# Patient Record
Sex: Female | Born: 1990 | Race: White | Hispanic: No | Smoking: Never smoker
Health system: Southern US, Community
[De-identification: ages and names within clinical notes are randomized; demographics above are authoritative.]

## PROBLEM LIST (undated history)

## (undated) DIAGNOSIS — F32A Depression, unspecified: Secondary | ICD-10-CM

## (undated) DIAGNOSIS — F419 Anxiety disorder, unspecified: Secondary | ICD-10-CM

## (undated) DIAGNOSIS — T7840XA Allergy, unspecified, initial encounter: Secondary | ICD-10-CM

## (undated) HISTORY — PX: FRACTURE SURGERY: SHX138

## (undated) HISTORY — DX: Depression, unspecified: F32.A

## (undated) HISTORY — DX: Anxiety disorder, unspecified: F41.9

## (undated) HISTORY — DX: Allergy, unspecified, initial encounter: T78.40XA

## (undated) HISTORY — PX: TONSILLECTOMY: SUR1361

## (undated) HISTORY — PX: ANKLE SURGERY: SHX546

---
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
## 2010-03-04 HISTORY — PX: KNEE SURGERY: SHX244

## 2012-10-09 ENCOUNTER — Ambulatory Visit: Payer: Self-pay | Admitting: Family Medicine

## 2013-02-10 ENCOUNTER — Ambulatory Visit: Payer: BC Managed Care – PPO | Admitting: Family Medicine

## 2013-02-10 VITALS — BP 118/86 | HR 78 | Temp 98.3°F | Resp 16 | Ht 62.5 in | Wt 139.6 lb

## 2013-02-10 DIAGNOSIS — J029 Acute pharyngitis, unspecified: Secondary | ICD-10-CM

## 2013-02-10 DIAGNOSIS — R05 Cough: Secondary | ICD-10-CM

## 2013-02-10 LAB — POCT RAPID STREP A (OFFICE): Rapid Strep A Screen: NEGATIVE

## 2013-02-10 LAB — POCT CBC
Lymph, poc: 3.3 (ref 0.6–3.4)
MCH, POC: 29 pg (ref 27–31.2)
MCHC: 31.3 g/dL — AB (ref 31.8–35.4)
MCV: 92.8 fL (ref 80–97)
MID (cbc): 0.8 (ref 0–0.9)
MPV: 7.2 fL (ref 0–99.8)
POC LYMPH PERCENT: 36.9 %L (ref 10–50)
POC MID %: 9.1 %M (ref 0–12)
Platelet Count, POC: 304 10*3/uL (ref 142–424)
RBC: 4.1 M/uL (ref 4.04–5.48)
RDW, POC: 13 %
WBC: 9 10*3/uL (ref 4.6–10.2)

## 2013-02-10 MED ORDER — PSEUDOEPHEDRINE HCL ER 120 MG PO TB12: 120.0000 mg | ORAL_TABLET | Freq: Two times a day (BID) | ORAL | Status: DC

## 2013-02-10 MED ORDER — HYDROCODONE-HOMATROPINE 5-1.5 MG/5ML PO SYRP: 5.0000 mL | ORAL_SOLUTION | Freq: Three times a day (TID) | ORAL | Status: DC | PRN

## 2013-02-10 MED ORDER — AZITHROMYCIN 250 MG PO TABS: ORAL_TABLET | ORAL | Status: DC

## 2013-02-10 MED ORDER — IPRATROPIUM BROMIDE 0.03 % NA SOLN: 2.0000 | Freq: Four times a day (QID) | NASAL | Status: DC

## 2013-02-10 NOTE — Progress Notes (Signed)
Subjective:    Patient ID: Anna Vargas, female    DOB: 1990-07-16, 22 y.o.   MRN: [id number] This chart was scribed for Anna Croft, MD by Clydene Laming, ED Scribe. This patient was seen in room 10 and the patient's care was started at 8:57 PM. Sore Throat  Associated symptoms include congestion, coughing, ear pain and headaches. Pertinent negatives include no vomiting.  Cough Associated symptoms include ear pain, a fever, headaches, myalgias, postnasal drip, rhinorrhea and a sore throat. Pertinent negatives include no chest pain or chills.   HPI Comments: Anna Vargas is a 22 y.o. female who presents to the [facility] complaining of a sore throat onset seven days ago with associated congestion, myalgias, fever, rhinorrhea, and a productive cough. Pt visited [employer] the day before symptoms began. Pt denies nausea or vomiting. Pt has taken tylenol and dayquil for mild, temporary relief.   There are no active problems to display for this patient.  History reviewed. No pertinent past medical history. Past Surgical History  Procedure Laterality Date  . Knee surgery  2012   Allergies  Allergen Reactions  . Latex Anaphylaxis   Prior to Admission medications   Not on File   History   Social History  . Marital Status: Single    Spouse Name: N/A    Number of Children: N/A  . Years of Education: N/A   Occupational History  . Not on file.   Social History Main Topics  . Smoking status: Never Smoker   . Smokeless tobacco: Not on file  . Alcohol Use: No  . Drug Use: No  . Sexual Activity: No   Other Topics Concern  . Not on file   Social History Narrative  . No narrative on file       Review of Systems  Constitutional: Positive for fever, diaphoresis, activity change, appetite change and fatigue. Negative for chills.  HENT: Positive for congestion, ear pain, postnasal drip, rhinorrhea and sore throat. Negative for dental problem and sinus  pressure.   Respiratory: Positive for cough. Negative for choking.   Cardiovascular: Negative for chest pain.  Gastrointestinal: Negative for nausea and vomiting.  Musculoskeletal: Positive for arthralgias and myalgias. Negative for neck stiffness.  Neurological: Positive for headaches.  Hematological: Positive for adenopathy.  Psychiatric/Behavioral: Positive for self-injury.      BP 118/86  Pulse 78  Temp(Src) 98.3 F (36.8 C) (Oral)  Resp 16  Ht 5' 2.5" (1.588 m)  Wt 139 lb 9.6 oz (63.322 kg)  BMI 25.11 kg/m2  SpO2 100%  LMP 02/07/2013 Objective:   Physical Exam  Nursing note and vitals reviewed. Constitutional: She is oriented to person, place, and time. She appears well-developed and well-nourished. No distress.  HENT:  Head: Normocephalic and atraumatic.  Right Ear: External ear and ear canal normal. A middle ear effusion is present.  Left Ear: External ear and ear canal normal. A middle ear effusion is present.  Nose: Nose normal. No mucosal edema or rhinorrhea.  Mouth/Throat: Uvula is midline and mucous membranes are normal. Mucous membranes are not pale and not dry. No trismus in the jaw. No uvula swelling. Posterior oropharyngeal edema present. No oropharyngeal exudate, posterior oropharyngeal erythema or tonsillar abscesses.  White streaking in posterial oropharnyx 2+ tonsillar swelling w/o erythema, no exudate  Eyes: Conjunctivae and EOM are normal. Right eye exhibits no discharge. Left eye exhibits no discharge. No scleral icterus.  Neck: Normal range of motion. Neck supple.  Cardiovascular: Normal rate,  regular rhythm, normal heart sounds and intact distal pulses.   Pulmonary/Chest: Effort normal and breath sounds normal. No respiratory distress.  Abdominal: Soft.  Musculoskeletal: Normal range of motion.  Lymphadenopathy:       Head (right side): Tonsillar adenopathy present. No submandibular, no preauricular and no posterior auricular adenopathy present.        Head (left side): Tonsillar adenopathy present. No submandibular, no preauricular and no posterior auricular adenopathy present.    She has cervical adenopathy.       Right cervical: Superficial cervical adenopathy present. No posterior cervical adenopathy present.      Left cervical: Superficial cervical adenopathy present. No posterior cervical adenopathy present.       Right: No supraclavicular adenopathy present.       Left: No supraclavicular adenopathy present.  Neurological: She is alert and oriented to person, place, and time.  Skin: Skin is warm and dry. She is not diaphoretic. No erythema.  Psychiatric: She has a normal mood and affect. Her behavior is normal. Judgment normal.      Results for orders placed in visit on 02/10/13  POCT INFLUENZA A/B      Result Value Range   Influenza A, POC Negative     Influenza B, POC Negative    POCT RAPID STREP A (OFFICE)      Result Value Range   Rapid Strep A Screen Negative  Negative  POCT CBC      Result Value Range   WBC 9.0  4.6 - 10.2 K/uL   Lymph, poc 3.3  0.6 - 3.4   POC LYMPH PERCENT 36.9  10 - 50 %L   MID (cbc) 0.8  0 - 0.9   POC MID % 9.1  0 - 12 %M   POC Granulocyte 4.9  2 - 6.9   Granulocyte percent 54.0  37 - 80 %G   RBC 4.10  4.04 - 5.48 M/uL   Hemoglobin 11.9 (*) 12.2 - 16.2 g/dL   HCT, POC 16.1  09.6 - 47.9 %   MCV 92.8  80 - 97 fL   MCH, POC 29.0  27 - 31.2 pg   MCHC 31.3 (*) 31.8 - 35.4 g/dL   RDW, POC 04.5     Platelet Count, POC 304  142 - 424 K/uL   MPV 7.2  0 - 99.8 fL   Assessment & Plan:  Sore throat - Plan: POCT Influenza A/B, POCT rapid strep A, POCT CBC, Epstein-Barr virus VCA antibody panel  Cough - Plan: POCT Influenza A/B, POCT rapid strep A. SNAP rx for zpack given in case sxs worsen. Advised symptomatic care while throat clx and EBV is pending.  Meds ordered this encounter  Medications  . azithromycin (ZITHROMAX) 250 MG tablet    Sig: Take 2 tabs PO x 1 dose, then 1 tab PO QD x 4 days     Dispense:  6 tablet    Refill:  0  . HYDROcodone-homatropine (HYCODAN) 5-1.5 MG/5ML syrup    Sig: Take 5 mLs by mouth every 8 (eight) hours as needed for cough.    Dispense:  120 mL    Refill:  0  . ipratropium (ATROVENT) 0.03 % nasal spray    Sig: Place 2 sprays into the nose 4 (four) times daily.    Dispense:  30 mL    Refill:  1  . pseudoephedrine (SUDAFED 12 HOUR) 120 MG 12 hr tablet    Sig: Take 1 tablet (120 mg total) by  mouth 2 (two) times daily.    Dispense:  30 tablet    Refill:  0     I personally performed the services described in this documentation, which was scribed in my presence. The recorded information has been reviewed and considered, and addended by me as needed.  Norberto Sorenson, MD MPH

## 2013-02-10 NOTE — Progress Notes (Deleted)
Subjective:    Patient ID: Anna Vargas, female    DOB: 1990/08/11, 22 y.o.   MRN: [id number] This chart was scribed for Clelia Croft, MD by Clydene Laming, ED Scribe. This patient was seen in room 10 and the patient's care was started at 8:57 PM. HPI HPI Comments: Anna Vargas is a 22 y.o. female who presents to the [facility] complaining of a sore throat onset seven days ago with associated congestion, myalgias, fever, rhinorrhea, and a productive cough. Pt visited [employer] the day before symptoms began. Pt denies nausea or vomiting. Pt has taken tylenol and dayquil for mild, temporary relief.   There are no active problems to display for this patient.  History reviewed. No pertinent past medical history. Past Surgical History  Procedure Laterality Date   Knee surgery  2012   Allergies  Allergen Reactions   Latex Anaphylaxis   Prior to Admission medications   Not on File   History   Social History   Marital Status: Single    Spouse Name: N/A    Number of Children: N/A   Years of Education: N/A   Occupational History   Not on file.   Social History Main Topics   Smoking status: Never Smoker    Smokeless tobacco: Not on file   Alcohol Use: No   Drug Use: No   Sexual Activity: No   Other Topics Concern   Not on file   Social History Narrative   No narrative on file       Review of Systems  Constitutional: Positive for fever, diaphoresis, activity change, appetite change and fatigue. Negative for chills.  HENT: Positive for congestion, ear pain, postnasal drip, rhinorrhea and sore throat. Negative for dental problem and sinus pressure.   Respiratory: Positive for cough. Negative for choking.   Cardiovascular: Negative for chest pain.  Gastrointestinal: Negative for nausea and vomiting.  Musculoskeletal: Positive for arthralgias and myalgias. Negative for neck stiffness.  Neurological: Positive for headaches.    Hematological: Positive for adenopathy.  Psychiatric/Behavioral: Positive for self-injury.      BP 118/86   Pulse 78   Temp(Src) 98.3 F (36.8 C) (Oral)   Resp 16   Ht 5' 2.5" (1.588 m)   Wt 139 lb 9.6 oz (63.322 kg)   BMI 25.11 kg/m2   SpO2 100%   LMP 02/07/2013 Objective:   Physical Exam  Nursing note and vitals reviewed. Constitutional: She is oriented to person, place, and time. She appears well-developed and well-nourished. No distress.  HENT:  Head: Normocephalic and atraumatic.  Right Ear: External ear and ear canal normal. A middle ear effusion is present.  Left Ear: External ear and ear canal normal. A middle ear effusion is present.  Nose: Nose normal. No mucosal edema or rhinorrhea.  Mouth/Throat: Uvula is midline and mucous membranes are normal. Mucous membranes are not pale and not dry. No trismus in the jaw. No uvula swelling. Posterior oropharyngeal edema present. No oropharyngeal exudate, posterior oropharyngeal erythema or tonsillar abscesses.  White streaking in posterial oropharnyx 2+ tonsillar swelling w/o erythema, no exudate  Eyes: Conjunctivae and EOM are normal. Right eye exhibits no discharge. Left eye exhibits no discharge. No scleral icterus.  Neck: Normal range of motion. Neck supple.  Cardiovascular: Normal rate, regular rhythm, normal heart sounds and intact distal pulses.   Pulmonary/Chest: Effort normal and breath sounds normal. No respiratory distress.  Abdominal: Soft.  Musculoskeletal: Normal range of motion.  Lymphadenopathy:  Head (right side): Tonsillar adenopathy present. No submandibular, no preauricular and no posterior auricular adenopathy present.       Head (left side): Tonsillar adenopathy present. No submandibular, no preauricular and no posterior auricular adenopathy present.    She has cervical adenopathy.       Right cervical: Superficial cervical adenopathy present. No posterior cervical adenopathy present.      Left cervical:  Superficial cervical adenopathy present. No posterior cervical adenopathy present.       Right: No supraclavicular adenopathy present.       Left: No supraclavicular adenopathy present.  Neurological: She is alert and oriented to person, place, and time.  Skin: Skin is warm and dry. She is not diaphoretic. No erythema.  Psychiatric: She has a normal mood and affect. Her behavior is normal. Judgment normal.      Assessment & Plan:  Sore throat - Plan: POCT Influenza A/B, POCT rapid strep A, POCT CBC, Epstein-Barr virus VCA antibody panel  Cough - Plan: POCT Influenza A/B, POCT rapid strep A    I personally performed the services described in this documentation, which was scribed in my presence. The recorded information has been reviewed and considered, and addended by me as needed.  Norberto Sorenson, MD MPH

## 2013-02-12 LAB — EPSTEIN-BARR VIRUS VCA ANTIBODY PANEL: EBV EA IgG: <5 U/mL (ref <9.0)

## 2013-02-13 LAB — CULTURE, GROUP A STREP

## 2013-02-14 ENCOUNTER — Telehealth: Payer: Self-pay | Admitting: Family Medicine

## 2013-02-14 NOTE — Telephone Encounter (Signed)
Pt would like a call back about her results from her mono test that she had done on 02/10/13.

## 2013-02-15 NOTE — Telephone Encounter (Signed)
Mono test was negative - she does not have mono and has not had it in the past so she IS susceptible to catching it in the future.  Also, no abnormal bacteria grew out of her throat culture. If she is not yet better, needs to come back for further eval.

## 2013-02-15 NOTE — Telephone Encounter (Signed)
Called her, to advise. Left message for her to call back

## 2013-02-17 NOTE — Telephone Encounter (Signed)
Patient advised of labs, and she feels better.

## 2013-04-06 ENCOUNTER — Ambulatory Visit (INDEPENDENT_AMBULATORY_CARE_PROVIDER_SITE_OTHER): Payer: BC Managed Care – PPO | Admitting: Emergency Medicine

## 2013-04-06 VITALS — BP 122/70 | HR 106 | Temp 98.8°F | Resp 17 | Ht 62.5 in | Wt 140.0 lb

## 2013-04-06 DIAGNOSIS — J111 Influenza due to unidentified influenza virus with other respiratory manifestations: Secondary | ICD-10-CM

## 2013-04-06 MED ORDER — OSELTAMIVIR PHOSPHATE 75 MG PO CAPS: 75.0000 mg | ORAL_CAPSULE | Freq: Two times a day (BID) | ORAL | Status: DC

## 2013-04-06 MED ORDER — HYDROCOD POLST-CHLORPHEN POLST 10-8 MG/5ML PO LQCR: 5.0000 mL | Freq: Two times a day (BID) | ORAL | Status: DC | PRN

## 2013-04-06 MED ORDER — PSEUDOEPHEDRINE-GUAIFENESIN ER 60-600 MG PO TB12: 1.0000 | ORAL_TABLET | Freq: Two times a day (BID) | ORAL | Status: AC

## 2013-04-06 NOTE — Progress Notes (Signed)
[facility] [address], [place] [id number] [phone]  Date:  04/06/2013   Name:  Anna MaduroCarlyn B Vargas   DOB:  [birth year]   MRN:  [id number]  PCP:  Nani GasserMETHENEY,CATHERINE, MD    Chief Complaint: Sore Throat, Nasal Congestion, Emesis, Generalized Body Aches, Chills and Eye Injury   History of Present Illness:  Anna MaduroCarlyn B Vargas is a 23 y.o. very pleasant female patient who presents with the following:  Ill yesterday with sudden onset sore throat, nasal congestion and post nasal drip.  Today has fever, chills, and a cough that is not productive.  No wheezing or shortness of breath.  Nauseated at times and vomited once.  No rash.  Malaise, fatigue, and myalgias.  No improvement with over the counter medications or other home remedies. Denies other complaint or health concern today.   There are no active problems to display for this patient.   No past medical history on file.  Past Surgical History  Procedure Laterality Date  . Knee surgery  2012    History  Substance Use Topics  . Smoking status: Never Smoker   . Smokeless tobacco: Not on file  . Alcohol Use: No    No family history on file.  Allergies  Allergen Reactions  . Latex Anaphylaxis    Medication list has been reviewed and updated.  No current outpatient prescriptions on file prior to visit.   No current facility-administered medications on file prior to visit.    Review of Systems:  As per HPI, otherwise negative.    Physical Examination: Filed Vitals:   04/06/13 1130  BP: 122/70  Pulse: 106  Temp: 98.8 F (37.1 C)  Resp: 17   Filed Vitals:   04/06/13 1130  Height: 5' 2.5" (1.588 m)  Weight: 140 lb (63.504 kg)   Body mass index is 25.18 kg/(m^2). Ideal Body Weight: Weight in (lb) to have BMI = 25: 138.6  GEN: WDWN, NAD, Non-toxic, A & O x 3 HEENT: Atraumatic, Normocephalic. Neck supple. No masses, No LAD. Ears and Nose: No external deformity. CV: RRR, No M/G/R. No  JVD. No thrill. No extra heart sounds. PULM: CTA B, no wheezes, crackles, rhonchi. No retractions. No resp. distress. No accessory muscle use. ABD: S, NT, ND, +BS. No rebound. No HSM. EXTR: No c/c/e NEURO Normal gait.  PSYCH: Normally interactive. Conversant. Not depressed or anxious appearing.  Calm demeanor.    Assessment and Plan: Influenza tamiflu  Signed,  Phillips OdorJeffery Satoshi Kalas, MD

## 2013-04-06 NOTE — Patient Instructions (Signed)

## 2013-04-14 ENCOUNTER — Telehealth: Payer: Self-pay

## 2013-04-14 NOTE — Telephone Encounter (Signed)
Patient states meds did not work and would like something else called into [facility] on [address]  [phone]

## 2013-04-16 NOTE — Telephone Encounter (Signed)
Left message to return call 

## 2013-04-19 NOTE — Telephone Encounter (Signed)
What are symptoms? If she is still sick, she should return. Was treated for flu on 04/06/13. Left detailed message to advise her to return. She should call back if she has questions.

## 2020-01-25 ENCOUNTER — Encounter: Admit: 2020-01-25 | Discharge: 2020-01-25 | Payer: Commercial Managed Care - HMO

## 2020-01-25 ENCOUNTER — Ambulatory Visit: Admit: 2020-01-25 | Discharge: 2020-01-25 | Payer: Commercial Managed Care - HMO

## 2020-01-25 DIAGNOSIS — F419 Anxiety disorder, unspecified: Secondary | ICD-10-CM

## 2020-01-25 DIAGNOSIS — J3501 Chronic tonsillitis: Secondary | ICD-10-CM

## 2020-01-25 DIAGNOSIS — J302 Other seasonal allergic rhinitis: Secondary | ICD-10-CM

## 2020-01-25 DIAGNOSIS — J342 Deviated nasal septum: Secondary | ICD-10-CM

## 2020-01-25 NOTE — Progress Notes
Date of Service: 01/25/2020    Subjective:             Alexandria Jimenez is a 29 y.o. female.    History of Present Illness       Review of Systems   Constitutional: Negative.    HENT: Positive for congestion and rhinorrhea.    Eyes: Positive for itching.   Respiratory: Negative.    Cardiovascular: Negative.    Gastrointestinal: Negative.    Endocrine: Negative.    Genitourinary: Negative.    Musculoskeletal: Positive for arthralgias and joint swelling.   Skin: Negative.    Allergic/Immunologic: Negative.    Neurological: Negative.    Hematological: Negative.    Psychiatric/Behavioral: Positive for decreased concentration, dysphoric mood, sleep disturbance and suicidal ideas. The patient is nervous/anxious.          Objective:         No current outpatient medications on file.     Vitals:    01/25/20 1308   BP: (!) 153/82   Pulse: (!) 132   Weight: 96.2 kg (212 lb)   Height: 162.6 cm (64")   PainSc: Zero     Body mass index is 36.39 kg/m.     Physical Exam         Assessment and Plan:

## 2020-01-25 NOTE — Progress Notes
chief complaint  Subjective        HPI:  Alexandria Jimenez is a 29 y.o. female who I was asked to see in consultation for evaluation of chronic sinusitis and nasal obstruction.  Hx of snoring and has done it her whole life.  No sleep apnea symptoms.  Snoring has been persistent.  Always has a congested nose on both side.  When more congested , snoring does worsen.  Sleeps on her side.  Does not come from a family of snorers.  Weight has increased over the past year.      Sense of smell is good; gets frequent sinus infections - 3/4 times a year of sinus pain, colored drainage, PND.  Usually takes sudafed and resolves on own.  Hx of depression and anxiety.  All the meds are in the am, klonopin, xanax, wellbutrin.    History of constant sore throat.  Has had long standing history of 10 plus tonsil infections a year.  Ever since she was a small kid, she had recurring tonsillar infections resulting in multiple course of antibiotics.      The following portions of the patient's history were reviewed and updated as appropriate: allergies, current medications, past family history, past medical history, past social history, past surgical history and problem list.          ROS above reviewed    No current outpatient medications on file.    General    Vitals:    01/25/20 1308   BP: (!) 153/82   Pulse: (!) 132     General:  Well-developed, well-nourished  Communication and Voice:  Clear pitch and clarity, age appropriate    Head and Face  Inspection:  Normocephalic and atraumatic without masses or lesions  Palpation:  Facial skeleton intact without bony stepoffs, no sinus tenderness  Salivary Glands:  No masses or tenderness  Facial Strength:  Facial motility symmetric and full bilaterally             Left -  House-Brackman Grade 1/6             Right - House-Brackman Grade 1/6    ENT  External nose:  No scar or anatomic deformity  Internal Nose:  Septum intact and midline.  No edema, polyps, or rhinorrhea  Lips, Teeth, and gums:  Mucosa and teeth intact and viable  TMJ:  No pain to palpation with full mobility  Oral cavity/oropharynx:  3+ tonsils with crypts  Nasopharynx:  No masses or lesions with intact mucosa  Hypopharynx:  Intact mucosa without pooling of secretions  Larynx:  Full true vocal cord mobility without lesions or masses    Neck  Neck and Trachea:  Midline trachea without mass or lesion  Thyroid:  No mass or nodularity  Lymphatics:  No lymphadenopathy    Respiratory  Respiratory effort:  Equal inspiration & expiration without use of accessory muscles. No stridor    Cardiovascular  Peripheral Vascular:  Warm extremities with equal pulses    Eyes  Nystagmus: None  EOM: Equal extraocular motion bilaterally    Neuro/Psych/Balance  Orientation: Patient oriented to person, place, and time  Affect: Appropriate mood and affect  Gait: Intact with no imbalance  Cranial nerves: II-XII are intact    Ear  External canal: Left - Canal is patent with intact skin                             Right -  Canal is patent with intact skin  Tympanic Membranes: Left - Clear and mobile                                            Right - Clear and mobile  Middle Ears: Left - Aerated, no effusion, no masses                          Right - Aerated, no effusion, no masses        Office Procedure    Nasal Endoscopy    Indications: 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 The following was observed:    Right Nasal Cavity and Paranasal Sinuses: no purulence or polyps    Left Nasal Cavity and Paranasal Sinuses: no purulence or polyps          Septum: deviated inferiorly to right  Other:    The patient tolerated the procedure well.                Impression/Plan:    Thank you for allowing me to see your patient in consultation.  Based on my exam and review of the patients records, the following is my impression and plan:    1.Chronic tonsilits    2. Deviated septum    Multiple tonsillar infections throughout the year for many years - tonsils are 3+ in size and cryptic.  She would benefit from a tonsillectomy but will have to deal with the cons of the 2 weeks recovery and risk of delayed post-operative bleeding.  Will likely help with snoring as well. She will talk to her husband and decide on whether or not to have the procedure      F/u:     Heloise Purpura, M.D.    Professor and Villa Herb, M.D. Chairman  Department of Otolaryngology-Head and Neck Surgery  [employer]

## 2020-01-31 ENCOUNTER — Ambulatory Visit: Admit: 2020-01-31 | Discharge: 2020-01-31 | Payer: Commercial Managed Care - HMO

## 2020-01-31 ENCOUNTER — Encounter: Admit: 2020-01-31 | Discharge: 2020-01-31 | Payer: Commercial Managed Care - HMO

## 2020-01-31 DIAGNOSIS — J3501 Chronic tonsillitis: Secondary | ICD-10-CM

## 2020-01-31 DIAGNOSIS — Z20822 Encounter for screening laboratory testing for COVID-19 virus in asymptomatic patient: Secondary | ICD-10-CM

## 2020-01-31 NOTE — Telephone Encounter
Returned patient call.  Surgery scheduled 12/8 along with associated appointments.

## 2020-01-31 NOTE — Telephone Encounter
Calling to reach nurse, wanting to follow through with scheduling surgery

## 2020-02-01 ENCOUNTER — Encounter: Admit: 2020-02-01 | Discharge: 2020-02-01 | Payer: Commercial Managed Care - HMO

## 2020-02-01 DIAGNOSIS — E785 Hyperlipidemia, unspecified: Secondary | ICD-10-CM

## 2020-02-01 DIAGNOSIS — J302 Other seasonal allergic rhinitis: Secondary | ICD-10-CM

## 2020-02-01 DIAGNOSIS — F419 Anxiety disorder, unspecified: Secondary | ICD-10-CM

## 2020-02-06 ENCOUNTER — Encounter: Admit: 2020-02-06 | Discharge: 2020-02-07 | Payer: Commercial Managed Care - HMO

## 2020-02-06 DIAGNOSIS — Z20822 Encounter for screening laboratory testing for COVID-19 virus in asymptomatic patient: Principal | ICD-10-CM

## 2020-02-08 ENCOUNTER — Encounter: Admit: 2020-02-08 | Discharge: 2020-02-08 | Payer: Commercial Managed Care - HMO

## 2020-02-09 ENCOUNTER — Encounter: Admit: 2020-02-09 | Discharge: 2020-02-09 | Payer: Commercial Managed Care - HMO

## 2020-02-09 ENCOUNTER — Ambulatory Visit: Admit: 2020-02-09 | Discharge: 2020-02-09 | Payer: Commercial Managed Care - HMO

## 2020-02-09 DIAGNOSIS — E785 Hyperlipidemia, unspecified: Secondary | ICD-10-CM

## 2020-02-09 DIAGNOSIS — J302 Other seasonal allergic rhinitis: Secondary | ICD-10-CM

## 2020-02-09 DIAGNOSIS — F419 Anxiety disorder, unspecified: Secondary | ICD-10-CM

## 2020-02-09 MED ORDER — ARTIFICIAL TEARS SINGLE DOSE DROPS GROUP
OPHTHALMIC | 0 refills | Status: DC
Start: 2020-02-09 — End: 2020-02-09
  Administered 2020-02-09: 17:00:00 1 [drp] via OPHTHALMIC

## 2020-02-09 MED ORDER — LIDOCAINE (PF) 200 MG/10 ML (2 %) IJ SYRG
INTRAVENOUS | 0 refills | Status: DC
Start: 2020-02-09 — End: 2020-02-09
  Administered 2020-02-09 (×2): 100 mg via INTRAVENOUS

## 2020-02-09 MED ORDER — CEFAZOLIN 1 GRAM IJ SOLR
INTRAVENOUS | 0 refills | Status: DC
Start: 2020-02-09 — End: 2020-02-09
  Administered 2020-02-09: 17:00:00 2 g via INTRAVENOUS

## 2020-02-09 MED ORDER — FENTANYL CITRATE (PF) 50 MCG/ML IJ SOLN
INTRAVENOUS | 0 refills | Status: DC
Start: 2020-02-09 — End: 2020-02-09
  Administered 2020-02-09: 16:00:00 100 ug via INTRAVENOUS
  Administered 2020-02-09 (×2): 50 ug via INTRAVENOUS

## 2020-02-09 MED ORDER — ROCURONIUM 10 MG/ML IV SOLN
INTRAVENOUS | 0 refills | Status: DC
Start: 2020-02-09 — End: 2020-02-09
  Administered 2020-02-09: 17:00:00 50 mg via INTRAVENOUS

## 2020-02-09 MED ORDER — ACETAMINOPHEN 1,000 MG/100 ML (10 MG/ML) IV SOLN
INTRAVENOUS | 0 refills | Status: DC
Start: 2020-02-09 — End: 2020-02-09
  Administered 2020-02-09: 17:00:00 1000 mg via INTRAVENOUS

## 2020-02-09 MED ORDER — MIDAZOLAM 1 MG/ML IJ SOLN
INTRAVENOUS | 0 refills | Status: DC
Start: 2020-02-09 — End: 2020-02-09

## 2020-02-09 MED ORDER — ONDANSETRON HCL (PF) 4 MG/2 ML IJ SOLN
INTRAVENOUS | 0 refills | Status: DC
Start: 2020-02-09 — End: 2020-02-09
  Administered 2020-02-09: 17:00:00 4 mg via INTRAVENOUS

## 2020-02-09 MED ORDER — PROPOFOL INJ 10 MG/ML IV VIAL
INTRAVENOUS | 0 refills | Status: DC
Start: 2020-02-09 — End: 2020-02-09
  Administered 2020-02-09: 17:00:00 200 mg via INTRAVENOUS

## 2020-02-09 MED ORDER — SUGAMMADEX 100 MG/ML IV SOLN
INTRAVENOUS | 0 refills | Status: DC
Start: 2020-02-09 — End: 2020-02-09
  Administered 2020-02-09: 17:00:00 200 mg via INTRAVENOUS

## 2020-02-09 MED ORDER — DEXAMETHASONE SODIUM PHOSPHATE 4 MG/ML IJ SOLN
INTRAVENOUS | 0 refills | Status: DC
Start: 2020-02-09 — End: 2020-02-09
  Administered 2020-02-09: 17:00:00 10 mg via INTRAVENOUS

## 2020-02-09 MED ORDER — SUCCINYLCHOLINE CHLORIDE 20 MG/ML IJ SOLN
INTRAVENOUS | 0 refills | Status: DC
Start: 2020-02-09 — End: 2020-02-09
  Administered 2020-02-09: 17:00:00 120 mg via INTRAVENOUS

## 2020-02-09 MED ORDER — PROPOFOL 10 MG/ML IV EMUL 20 ML (INFUSION)(AM)(OR)
INTRAVENOUS | 0 refills | Status: DC
Start: 2020-02-09 — End: 2020-02-09
  Administered 2020-02-09: 17:00:00 50 ug/kg/min via INTRAVENOUS

## 2020-02-09 MED ADMIN — FENTANYL CITRATE (PF) 50 MCG/ML IJ SOLN [3037]: 50 ug | INTRAVENOUS | @ 18:00:00 | Stop: 2020-02-09 | NDC 00641602701

## 2020-02-09 MED ADMIN — LACTATED RINGERS IV SOLP [4318]: INTRAVENOUS | @ 15:00:00 | Stop: 2020-02-09 | NDC 00338011704

## 2020-02-09 MED ADMIN — HALOPERIDOL LACTATE 5 MG/ML IJ SOLN [3584]: 1 mg | INTRAVENOUS | @ 18:00:00 | Stop: 2020-02-09 | NDC 63323047400

## 2020-02-09 NOTE — Telephone Encounter
Called pharmacy and okay to fill oxycodone per Dr. Teodora Medici.  Also called pt's spouse and cautioned use of PRN xanax while taking oxycodone  Cautioned to not stop daily klonopin or rexulti but to use the oxycodone for pain not more than directed. Confirmed spouse would be home for observation and advice given that he administer oxycodone.  Gave caution of increased risk of CNS depression.  He verbalized understanding

## 2020-02-09 NOTE — Telephone Encounter
Returned call to pharmacy.  No ans. On pharmacist line.  LVM

## 2020-02-09 NOTE — Telephone Encounter
Requesting a approval on drug interaction related to oxyCODONE (ROXICODONE) 1 mg/mL oral solution  before pharmacy can release it to patient.

## 2020-02-10 ENCOUNTER — Encounter: Admit: 2020-02-10 | Discharge: 2020-02-10 | Payer: Commercial Managed Care - HMO

## 2020-02-10 NOTE — Telephone Encounter
Medical related concerns post surgery, please reply to [facility] message.

## 2020-02-10 NOTE — Telephone Encounter
Replied through My Chart.

## 2020-02-11 ENCOUNTER — Encounter: Admit: 2020-02-11 | Discharge: 2020-02-11 | Payer: Commercial Managed Care - HMO

## 2020-02-11 DIAGNOSIS — E785 Hyperlipidemia, unspecified: Secondary | ICD-10-CM

## 2020-02-11 DIAGNOSIS — F419 Anxiety disorder, unspecified: Secondary | ICD-10-CM

## 2020-02-11 DIAGNOSIS — J302 Other seasonal allergic rhinitis: Secondary | ICD-10-CM

## 2020-02-11 NOTE — Telephone Encounter
Alexandria Jimenez is a patient of Dr. Rhona Leavens s/p tonsillectomy on 02/09/20. She contacted the ENT on call today regarding a new fever and body aches. I recommended she get tested for COVID if these symptoms persist. She is tolerating PO and doing well otherwise from her surgery.     [name] MD  Otolaryngology Resident, PGY-2  Pager [phone]

## 2020-02-13 ENCOUNTER — Encounter: Admit: 2020-02-13 | Discharge: 2020-02-13 | Payer: Commercial Managed Care - HMO

## 2020-02-22 ENCOUNTER — Ambulatory Visit: Admit: 2020-02-22 | Discharge: 2020-02-22 | Payer: Commercial Managed Care - HMO

## 2020-02-22 ENCOUNTER — Encounter: Admit: 2020-02-22 | Discharge: 2020-02-22 | Payer: Commercial Managed Care - HMO

## 2020-02-22 DIAGNOSIS — J302 Other seasonal allergic rhinitis: Secondary | ICD-10-CM

## 2020-02-22 DIAGNOSIS — J3501 Chronic tonsillitis: Secondary | ICD-10-CM

## 2020-02-22 DIAGNOSIS — E785 Hyperlipidemia, unspecified: Secondary | ICD-10-CM

## 2020-02-22 DIAGNOSIS — F419 Anxiety disorder, unspecified: Secondary | ICD-10-CM

## 2020-02-22 NOTE — Progress Notes
S/p tonsillectomy    Had a couple of rough days but feeling well now.  Has been able to eat better the past couple of days.  Is not needing any more narcotics. No bleeding    Only concern is a bilateral hand tremor that she has had since surgery.  Never had it before.    Will ask her to watch it for another week and if no improvement, will refer to Neurology for consultation

## 2020-02-22 NOTE — Progress Notes
Date of Service: 02/22/2020    Subjective:             Alexandria Jimenez is a 29 y.o. female.    History of Present Illness       Review of Systems   Constitutional: Negative.    HENT: Negative.    Eyes: Negative.    Respiratory: Negative.    Cardiovascular: Negative.    Gastrointestinal: Negative.    Endocrine: Negative.    Genitourinary: Negative.    Musculoskeletal: Negative.    Skin: Negative.    Allergic/Immunologic: Negative.    Neurological: Negative.    Hematological: Negative.    Psychiatric/Behavioral: Negative.          Objective:          acetaminophen (TYLENOL) 160 mg/5 mL oral solution Take 20.3 mL by mouth every 4 hours.    ALPRAZolam (XANAX) 0.5 mg tablet TAKE ONE TABLET BY MOUTH EVERY DAY AS NEEDED FOR ANXIETY    buPROPion XL (WELLBUTRIN XL) 300 mg tablet Take 300 mg by mouth daily.    citalopram (CELEXA) 40 mg tablet TAKE ONE TABLET BY MOUTH EVERY DAY FOR MOOD    clonazePAM (KLONOPIN) 0.5 mg tablet Take 0.5 mg by mouth daily. in morning    methylPREDNIsolone (MEDROL (PAK)) 4 mg tablet Take medication as directed on package for 6 days. Take with food.    oxyCODONE (ROXICODONE) 1 mg/mL oral solution Take 5 mL by mouth every 4 hours as needed for Pain (pain uncontrolled by acetaminophen)    REXULTI 2 mg tablet Take 2 mg by mouth daily.    VYVANSE 50 mg capsule Take 50 mg by mouth daily     Vitals:    02/22/20 1342   Weight: 96.2 kg (212 lb)   Height: 162.6 cm (64.02")   PainSc: Zero     Body mass index is 36.37 kg/m.     Physical Exam         Assessment and Plan:

## 2020-05-30 ENCOUNTER — Emergency Department (HOSPITAL_BASED_OUTPATIENT_CLINIC_OR_DEPARTMENT_OTHER)
Admission: EM | Admit: 2020-05-30 | Discharge: 2020-05-30 | Disposition: A | Discharge status: Home / Self Care | Payer: BLUE CROSS/BLUE SHIELD | Attending: Emergency Medicine | Admitting: Emergency Medicine

## 2020-05-30 ENCOUNTER — Encounter (HOSPITAL_BASED_OUTPATIENT_CLINIC_OR_DEPARTMENT_OTHER): Payer: Self-pay

## 2020-05-30 ENCOUNTER — Other Ambulatory Visit: Payer: Self-pay

## 2020-05-30 DIAGNOSIS — G43909 Migraine, unspecified, not intractable, without status migrainosus: Secondary | ICD-10-CM | POA: Diagnosis not present

## 2020-05-30 DIAGNOSIS — G43009 Migraine without aura, not intractable, without status migrainosus: Secondary | ICD-10-CM | POA: Diagnosis not present

## 2020-05-30 DIAGNOSIS — R519 Headache, unspecified: Secondary | ICD-10-CM | POA: Diagnosis not present

## 2020-05-30 LAB — PREGNANCY, URINE: Preg Test, Ur: NEGATIVE

## 2020-05-30 MED ORDER — PROCHLORPERAZINE EDISYLATE 10 MG/2ML IJ SOLN
10.0000 mg | Freq: Once | INTRAMUSCULAR | Status: AC
  Administered 2020-05-30: 10 mg via INTRAVENOUS
  Filled 2020-05-30: qty 2

## 2020-05-30 MED ORDER — SODIUM CHLORIDE 0.9 % IV BOLUS
250.0000 mL | Freq: Once | INTRAVENOUS | Status: AC
  Administered 2020-05-30: 250 mL via INTRAVENOUS

## 2020-05-30 MED ORDER — DIPHENHYDRAMINE HCL 50 MG/ML IJ SOLN
25.0000 mg | Freq: Once | INTRAMUSCULAR | Status: AC
  Administered 2020-05-30: 25 mg via INTRAVENOUS
  Filled 2020-05-30: qty 1

## 2020-05-30 MED ORDER — DEXAMETHASONE SODIUM PHOSPHATE 10 MG/ML IJ SOLN
10.0000 mg | Freq: Once | INTRAMUSCULAR | Status: AC
  Administered 2020-05-30: 10 mg via INTRAVENOUS
  Filled 2020-05-30: qty 1

## 2020-05-30 NOTE — ED Provider Notes (Signed)
[facility] EMERGENCY DEPARTMENT Provider Note   CSN: [id number] Arrival date & time: 05/30/20  9937     History Chief Complaint  Patient presents with  . Migraine    Anna Vargas is a 30 y.o. female.  The history is provided by the patient.  Headache Pain location:  Generalized Quality:  Dull Onset quality:  Gradual Duration:  2 days Timing:  Intermittent Progression:  Waxing and waning Chronicity:  New Context: bright light   Relieved by:  Nothing Worsened by:  Nothing Ineffective treatments:  NSAIDs Associated symptoms: photophobia   Associated symptoms: no abdominal pain, no back pain, no blurred vision, no congestion, no cough, no diarrhea, no dizziness, no drainage, no ear pain, no eye pain, no facial pain, no fatigue, no fever, no focal weakness, no hearing loss, no loss of balance, no myalgias, no nausea, no near-syncope, no neck pain, no neck stiffness, no numbness, no paresthesias, no seizures, no sinus pressure, no sore throat, no swollen glands, no syncope, no tingling, no URI, no visual change, no vomiting and no weakness        History reviewed. No pertinent past medical history.  There are no problems to display for this patient.   Past Surgical History:  Procedure Laterality Date  . KNEE SURGERY  2012     OB History   No obstetric history on file.     History reviewed. No pertinent family history.  Social History   Tobacco Use  . Smoking status: Never Smoker  Substance Use Topics  . Alcohol use: No  . Drug use: No    Home Medications Prior to Admission medications   Medication Sig Start Date End Date Taking? Authorizing Provider  chlorpheniramine-HYDROcodone (TUSSIONEX PENNKINETIC ER) 10-8 MG/5ML LQCR Take 5 mLs by mouth every 12 (twelve) hours as needed. 04/06/13   Carmelina Dane, MD  oseltamivir (TAMIFLU) 75 MG capsule Take 1 capsule (75 mg total) by mouth 2 (two) times daily. 04/06/13   Carmelina Dane, MD     Allergies    Latex  Review of Systems   Review of Systems  Constitutional: Negative for fatigue and fever.  HENT: Negative for congestion, ear pain, hearing loss, postnasal drip, sinus pressure and sore throat.   Eyes: Positive for photophobia. Negative for blurred vision and pain.  Respiratory: Negative for cough.   Cardiovascular: Negative for syncope and near-syncope.  Gastrointestinal: Negative for abdominal pain, diarrhea, nausea and vomiting.  Musculoskeletal: Negative for back pain, myalgias, neck pain and neck stiffness.  Neurological: Positive for headaches. Negative for dizziness, focal weakness, seizures, weakness, numbness, paresthesias and loss of balance.    Physical Exam Updated Vital Signs BP 126/72 (BP Location: Right Arm)   Pulse (!) 110   Temp 98.2 F (36.8 C) (Oral)   Resp 16   Ht 5\' 4"  (1.626 m)   Wt 97.1 kg   SpO2 100%   BMI 36.73 kg/m   Physical Exam Vitals and nursing note reviewed.  Constitutional:      General: She is not in acute distress.    Appearance: She is well-developed. She is not ill-appearing.  HENT:     Head: Normocephalic and atraumatic.     Nose: Nose normal.     Mouth/Throat:     Mouth: Mucous membranes are moist.  Eyes:     Extraocular Movements: Extraocular movements intact.     Conjunctiva/sclera: Conjunctivae normal.     Pupils: Pupils are equal, round, and reactive to light.  Cardiovascular:     Rate and Rhythm: Normal rate and regular rhythm.     Heart sounds: No murmur heard.   Pulmonary:     Effort: Pulmonary effort is normal. No respiratory distress.     Breath sounds: Normal breath sounds.  Abdominal:     Palpations: Abdomen is soft.     Tenderness: There is no abdominal tenderness.  Musculoskeletal:     Cervical back: Normal range of motion and neck supple. No rigidity.  Skin:    General: Skin is warm and dry.     Capillary Refill: Capillary refill takes less than 2 seconds.  Neurological:     General:  No focal deficit present.     Mental Status: She is alert and oriented to person, place, and time.     Cranial Nerves: No cranial nerve deficit.     Sensory: No sensory deficit.     Motor: No weakness.     Coordination: Coordination normal.     Gait: Gait normal.     Comments: 5+ out of 5 strength throughout, normal sensation, no drift, normal speech, normal finger-nose-finger     ED Results / Procedures / Treatments   Labs (all labs ordered are listed, but only abnormal results are displayed) Labs Reviewed  PREGNANCY, URINE    EKG None  Radiology No results found.  Procedures Procedures   Medications Ordered in ED Medications  dexamethasone (DECADRON) injection 10 mg (has no administration in time range)  sodium chloride 0.9 % bolus 250 mL (250 mLs Intravenous New Bag/Given 05/30/20 0948)  prochlorperazine (COMPAZINE) injection 10 mg (10 mg Intravenous Given 05/30/20 0948)  diphenhydrAMINE (BENADRYL) injection 25 mg (25 mg Intravenous Given 05/30/20 0948)    ED Course  I have reviewed the triage vital signs and the nursing notes.  Pertinent labs & imaging results that were available during my care of the patient were reviewed by me and considered in my medical decision making (see chart for details).    MDM Rules/Calculators/A&P                          Anna Vargas is here with migraine headache.  Overall unremarkable vitals.  History of migraines as a child but has not had any in years.  Family history of migraines.  Having photophobia, frontal headache for the last 2 days.  No under clap onset.  Some relief with anti-inflammatories.  No numbness or weakness.  Neurologically she is intact.  No neck pain.  No fever.  No concern for meningitis.  No concern for subarachnoid hemorrhage.  Appears to be atypical migraine type headache.  Did offer head CT given that she has not had headaches in a long time but at this time she would like to hold off on that.  I think that this  is reasonable.  We will treat with headache cocktail.  Recommend that if she continued to have persistent headaches that she likely needs some head imaging.  We will give her information to follow-up with primary care.  Will reevaluate after giving her headache cocktail.  Feeling much improved after headache cocktail.  Discharged in good condition.  Understands return precautions.  This chart was dictated using voice recognition software.  Despite best efforts to proofread,  errors can occur which can change the documentation meaning.   Final Clinical Impression(s) / ED Diagnoses Final diagnoses:  Migraine without aura and without status migrainosus, not intractable  Rx / DC Orders ED Discharge Orders    None       Virgina Norfolk, DO 05/30/20 1023

## 2020-05-30 NOTE — ED Triage Notes (Signed)
Headache starting yesterday morning, only hx of migraines was in middle school per patient, nausea/vomiting.

## 2020-05-30 NOTE — Discharge Instructions (Signed)
Continue 800 mg of ibuprofen every 8 hours as needed, continue 1000 mg of Tylenol every 6 hours as needed for headache.

## 2020-06-09 DIAGNOSIS — F411 Generalized anxiety disorder: Secondary | ICD-10-CM | POA: Diagnosis not present

## 2020-06-09 DIAGNOSIS — F3341 Major depressive disorder, recurrent, in partial remission: Secondary | ICD-10-CM | POA: Diagnosis not present

## 2020-06-09 DIAGNOSIS — Z79891 Long term (current) use of opiate analgesic: Secondary | ICD-10-CM | POA: Diagnosis not present

## 2020-07-11 DIAGNOSIS — F3341 Major depressive disorder, recurrent, in partial remission: Secondary | ICD-10-CM | POA: Diagnosis not present

## 2020-07-11 DIAGNOSIS — F411 Generalized anxiety disorder: Secondary | ICD-10-CM | POA: Diagnosis not present

## 2020-08-08 DIAGNOSIS — R051 Acute cough: Secondary | ICD-10-CM | POA: Diagnosis not present

## 2020-08-08 DIAGNOSIS — J069 Acute upper respiratory infection, unspecified: Secondary | ICD-10-CM | POA: Diagnosis not present

## 2020-08-08 DIAGNOSIS — R0602 Shortness of breath: Secondary | ICD-10-CM | POA: Diagnosis not present

## 2020-08-08 DIAGNOSIS — R079 Chest pain, unspecified: Secondary | ICD-10-CM | POA: Diagnosis not present

## 2020-08-08 DIAGNOSIS — Z20822 Contact with and (suspected) exposure to covid-19: Secondary | ICD-10-CM | POA: Diagnosis not present

## 2020-08-08 DIAGNOSIS — R5383 Other fatigue: Secondary | ICD-10-CM | POA: Diagnosis not present

## 2020-08-08 DIAGNOSIS — R000 Tachycardia, unspecified: Secondary | ICD-10-CM | POA: Diagnosis not present

## 2020-08-14 DIAGNOSIS — F411 Generalized anxiety disorder: Secondary | ICD-10-CM | POA: Diagnosis not present

## 2020-08-14 DIAGNOSIS — F3341 Major depressive disorder, recurrent, in partial remission: Secondary | ICD-10-CM | POA: Diagnosis not present

## 2020-10-10 DIAGNOSIS — F3341 Major depressive disorder, recurrent, in partial remission: Secondary | ICD-10-CM | POA: Diagnosis not present

## 2020-10-10 DIAGNOSIS — F411 Generalized anxiety disorder: Secondary | ICD-10-CM | POA: Diagnosis not present

## 2020-11-07 ENCOUNTER — Other Ambulatory Visit: Payer: Self-pay

## 2020-11-07 ENCOUNTER — Ambulatory Visit
Admission: EM | Admit: 2020-11-07 | Discharge: 2020-11-07 | Disposition: A | Discharge status: Home / Self Care | Payer: BLUE CROSS/BLUE SHIELD

## 2020-11-07 DIAGNOSIS — S99922A Unspecified injury of left foot, initial encounter: Secondary | ICD-10-CM | POA: Diagnosis not present

## 2020-11-07 DIAGNOSIS — E559 Vitamin D deficiency, unspecified: Secondary | ICD-10-CM | POA: Diagnosis not present

## 2020-11-07 DIAGNOSIS — E7800 Pure hypercholesterolemia, unspecified: Secondary | ICD-10-CM | POA: Diagnosis not present

## 2020-11-07 DIAGNOSIS — M7989 Other specified soft tissue disorders: Secondary | ICD-10-CM

## 2020-11-07 DIAGNOSIS — N951 Menopausal and female climacteric states: Secondary | ICD-10-CM | POA: Diagnosis not present

## 2020-11-07 DIAGNOSIS — R635 Abnormal weight gain: Secondary | ICD-10-CM | POA: Diagnosis not present

## 2020-11-07 DIAGNOSIS — F411 Generalized anxiety disorder: Secondary | ICD-10-CM | POA: Diagnosis not present

## 2020-11-07 DIAGNOSIS — F3341 Major depressive disorder, recurrent, in partial remission: Secondary | ICD-10-CM | POA: Diagnosis not present

## 2020-11-07 NOTE — ED Triage Notes (Addendum)
Pt reports left foot pain, denies injury to area.  Started: yesterday  Home interventions: Ice and Ibuprofen- somewhat helpful

## 2020-11-07 NOTE — ED Provider Notes (Signed)
[facility]    CSN: [id number] Arrival date & time: 11/07/20  0856      History   Chief Complaint Chief Complaint  Patient presents with   Foot Pain    HPI Anna Vargas is a 30 y.o. female.  She reports a history of left foot injuries in the past on the medial side of her foot.  Yesterday, while hiking 7-1/2 miles, about a third of the way into the hike, she noticed lateral left foot pain.  She proceeded to finish the hike, and after the hike the lateral side of her left foot was very painful and very swollen.  The swelling then spread to the rest of her foot.  She has treated it with ice and ibuprofen which helped reduce the swelling and pain significantly so that she was able to walk, here today.  She denies any injury mechanism: No fall, did not roll her ankle, did not misstep, hiking terrain was flat.  She wears supportive boots with orthotics.  She has not injured that side of her foot before. She does not have any significant health problems such as osteoporosis.  She is not feel there is a foreign body in her foot.    Foot Pain   History reviewed. No pertinent past medical history.  There are no problems to display for this patient.   Past Surgical History:  Procedure Laterality Date   KNEE SURGERY  2012    OB History   No obstetric history on file.      Home Medications    Prior to Admission medications   Medication Sig Start Date End Date Taking? Authorizing Provider  ALPRAZolam Prudy Feeler) 0.5 MG tablet Take 0.5 mg by mouth as needed. 10/10/20   [provider]  buPROPion (WELLBUTRIN XL) 300 MG 24 hr tablet Take 300 mg by mouth every morning. 08/11/20   [provider]  chlorpheniramine-HYDROcodone (TUSSIONEX PENNKINETIC ER) 10-8 MG/5ML LQCR Take 5 mLs by mouth every 12 (twelve) hours as needed. 04/06/13   Carmelina Dane, MD  citalopram (CELEXA) 40 MG tablet Take 40 mg by mouth at bedtime. 08/11/20   [provider]   levonorgestrel (MIRENA) 20 MCG/DAY IUD by Intrauterine route.    [provider]  methylphenidate 54 MG PO CR tablet Take 54 mg by mouth every morning. 10/10/20   [provider]  oseltamivir (TAMIFLU) 75 MG capsule Take 1 capsule (75 mg total) by mouth 2 (two) times daily. 04/06/13   Carmelina Dane, MD  REXULTI 2 MG TABS tablet Take 2 mg by mouth every morning. 10/04/20   [provider]    Family History History reviewed. No pertinent family history.  Social History Social History   Tobacco Use   Smoking status: Never  Substance Use Topics   Alcohol use: No   Drug use: No     Allergies   Latex   Review of Systems Review of Systems  Neurological:  Negative for weakness and numbness.    Physical Exam Triage Vital Signs ED Triage Vitals  Enc Vitals Group     BP 11/07/20 1001 123/75     Pulse Rate 11/07/20 1001 100     Resp 11/07/20 1001 18     Temp 11/07/20 1001 98.5 F (36.9 C)     Temp Source 11/07/20 1001 Oral     SpO2 11/07/20 1001 98 %     Weight --      Height --  Head Circumference --      Peak Flow --      Pain Score 11/07/20 0958 4     Pain Loc --      Pain Edu? --      Excl. in GC? --    No data found.  Updated Vital Signs BP 123/75 (BP Location: Right Arm)   Pulse 100   Temp 98.5 F (36.9 C) (Oral)   Resp 18   SpO2 98%   Visual Acuity Right Eye Distance:   Left Eye Distance:   Bilateral Distance:    Right Eye Near:   Left Eye Near:    Bilateral Near:     Physical Exam Constitutional:      Appearance: Normal appearance.  Musculoskeletal:        General: Normal range of motion.     Right foot: Normal.     Left foot: Normal range of motion and normal capillary refill. Swelling and tenderness present. No deformity or bony tenderness. Normal pulse.     Comments: Left foot and ankle are swollen compared with right.  Skin:    General: Skin is warm and dry.     Comments: Skin is intact, no abrasions,  lesions, or foreign body such as splinter noted in left foot.  Neurological:     General: No focal deficit present.     Mental Status: She is alert.     Motor: No weakness.     UC Treatments / Results  Labs (all labs ordered are listed, but only abnormal results are displayed) Labs Reviewed - No data to display  EKG   Radiology No results found.  Procedures Procedures (including critical care time)  Medications Ordered in UC Medications - No data to display  Initial Impression / Assessment and Plan / UC Course  I have reviewed the triage vital signs and the nursing notes.  Pertinent labs & imaging results that were available during my care of the patient were reviewed by me and considered in my medical decision making (see chart for details).  I do not suspect a fracture in this young person with no previous injury to her foot in this area.  I suggested supportive treatment with ice, ibuprofen, and soaking her foot in warm water with Epson salt for the next couple of days.  I think this will reduce the inflammation in her foot and resolve her symptoms.  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   Final Clinical Impressions(s) / UC Diagnoses   Final diagnoses:  Foot injury, left, initial encounter  Swelling of left foot     Discharge Instructions      Also try soaking in warm water with Epson salt 2-3 times a day for the next 2 to 3 days to help relieve swelling and pain.  Continue icing and ibuprofen as you have been doing.  Consider elevating your foot when you can to help relieve the swelling.     ED Prescriptions   None    PDMP not reviewed this encounter.   Cathlyn Parsons, NP 11/07/20 1057

## 2020-11-07 NOTE — Discharge Instructions (Addendum)
Also try soaking in warm water with Epson salt 2-3 times a day for the next 2 to 3 days to help relieve swelling and pain.  Continue icing and ibuprofen as you have been doing.  Consider elevating your foot when you can to help relieve the swelling.

## 2020-11-16 DIAGNOSIS — Z1339 Encounter for screening examination for other mental health and behavioral disorders: Secondary | ICD-10-CM | POA: Diagnosis not present

## 2020-11-16 DIAGNOSIS — E7800 Pure hypercholesterolemia, unspecified: Secondary | ICD-10-CM | POA: Diagnosis not present

## 2020-11-16 DIAGNOSIS — F3342 Major depressive disorder, recurrent, in full remission: Secondary | ICD-10-CM | POA: Diagnosis not present

## 2020-11-16 DIAGNOSIS — Z1331 Encounter for screening for depression: Secondary | ICD-10-CM | POA: Diagnosis not present

## 2020-11-16 DIAGNOSIS — F419 Anxiety disorder, unspecified: Secondary | ICD-10-CM | POA: Diagnosis not present

## 2020-11-16 DIAGNOSIS — N951 Menopausal and female climacteric states: Secondary | ICD-10-CM | POA: Diagnosis not present

## 2020-11-28 DIAGNOSIS — E7800 Pure hypercholesterolemia, unspecified: Secondary | ICD-10-CM | POA: Diagnosis not present

## 2020-11-28 DIAGNOSIS — Z6841 Body Mass Index (BMI) 40.0 and over, adult: Secondary | ICD-10-CM | POA: Diagnosis not present

## 2020-12-05 DIAGNOSIS — E559 Vitamin D deficiency, unspecified: Secondary | ICD-10-CM | POA: Diagnosis not present

## 2020-12-05 DIAGNOSIS — Z6841 Body Mass Index (BMI) 40.0 and over, adult: Secondary | ICD-10-CM | POA: Diagnosis not present

## 2020-12-12 DIAGNOSIS — E7800 Pure hypercholesterolemia, unspecified: Secondary | ICD-10-CM | POA: Diagnosis not present

## 2020-12-12 DIAGNOSIS — F411 Generalized anxiety disorder: Secondary | ICD-10-CM | POA: Diagnosis not present

## 2020-12-12 DIAGNOSIS — F3341 Major depressive disorder, recurrent, in partial remission: Secondary | ICD-10-CM | POA: Diagnosis not present

## 2020-12-12 DIAGNOSIS — Z6841 Body Mass Index (BMI) 40.0 and over, adult: Secondary | ICD-10-CM | POA: Diagnosis not present

## 2020-12-19 DIAGNOSIS — E559 Vitamin D deficiency, unspecified: Secondary | ICD-10-CM | POA: Diagnosis not present

## 2020-12-19 DIAGNOSIS — Z6841 Body Mass Index (BMI) 40.0 and over, adult: Secondary | ICD-10-CM | POA: Diagnosis not present

## 2021-01-16 ENCOUNTER — Ambulatory Visit (HOSPITAL_COMMUNITY)
Admission: EM | Admit: 2021-01-16 | Discharge: 2021-01-16 | Disposition: A | Discharge status: Home / Self Care | Payer: BLUE CROSS/BLUE SHIELD | Attending: Family Medicine | Admitting: Family Medicine

## 2021-01-16 ENCOUNTER — Other Ambulatory Visit: Payer: Self-pay

## 2021-01-16 ENCOUNTER — Encounter (HOSPITAL_COMMUNITY): Payer: Self-pay

## 2021-01-16 ENCOUNTER — Ambulatory Visit (INDEPENDENT_AMBULATORY_CARE_PROVIDER_SITE_OTHER): Payer: BLUE CROSS/BLUE SHIELD

## 2021-01-16 DIAGNOSIS — M25572 Pain in left ankle and joints of left foot: Secondary | ICD-10-CM

## 2021-01-16 DIAGNOSIS — M79672 Pain in left foot: Secondary | ICD-10-CM

## 2021-01-16 DIAGNOSIS — S99912A Unspecified injury of left ankle, initial encounter: Secondary | ICD-10-CM | POA: Diagnosis not present

## 2021-01-16 NOTE — ED Triage Notes (Signed)
Pt reports slipping on mud 45 minutes ago. States she her L ankle popped and states she cannot rotate it.

## 2021-01-17 ENCOUNTER — Ambulatory Visit: Payer: BLUE CROSS/BLUE SHIELD | Admitting: Podiatry

## 2021-01-17 ENCOUNTER — Encounter: Payer: Self-pay | Admitting: Podiatry

## 2021-01-17 DIAGNOSIS — S93402A Sprain of unspecified ligament of left ankle, initial encounter: Secondary | ICD-10-CM | POA: Diagnosis not present

## 2021-01-17 NOTE — ED Provider Notes (Signed)
[facility]   [id number] 01/16/21 Arrival Time: 1924  ASSESSMENT & PLAN:  1. Left foot pain     I have personally viewed the imaging studies ordered this visit. Ques talar fx. See radiology report. Discussed with pt.  Orders Placed This Encounter  Procedures   DG Ankle Complete Left   Apply CAM boot    Recommend:  Follow-up Information     Schedule an appointment as soon as possible for a visit  with [facility]).   Contact information: [address] [place],  [place]  [address]  [phone]                Reviewed expectations re: course of current medical issues. Questions answered. Outlined signs and symptoms indicating need for more acute intervention. Patient verbalized understanding. After Visit Summary given.  SUBJECTIVE: History from: patient. Anna Vargas is a 30 y.o. female who reports slipping in mud; today; "bent foot back"; immediate L foot dorsal pain; able to br wt. No extremity sensation changes or weakness.  No tx PTA.  Past Surgical History:  Procedure Laterality Date   KNEE SURGERY  2012      OBJECTIVE:  Vitals:   01/16/21 1955  BP: 110/74  Pulse: 94  Resp: 18  Temp: 99 F (37.2 C)  TempSrc: Oral  SpO2: 100%    General appearance: alert; no distress HEENT: Bone Gap; AT Neck: supple with FROM Resp: unlabored respirations Extremities: LLE: warm with well perfused appearance; fairly well localized moderate tenderness over left proximal dorsal foot; without gross deformities; swelling: minimal; bruising: none; ankle ROM: limited by reported pain CV: brisk extremity capillary refill of LLE; 2+ DP pulse of LLE. Skin: warm and dry; no visible rashes Neurologic: normal sensation and strength of LLE Psychological: alert and cooperative; normal mood and affect  Imaging: DG Ankle Complete Left  Result Date: 01/16/2021 CLINICAL DATA:  Fall, left ankle injury EXAM: LEFT ANKLE COMPLETE - 3+  VIEW COMPARISON:  None. FINDINGS: Small bone fragment noted along the anterior aspect of the distal talus appears well corticated and may be related to old injury. No additional acute fracture, subluxation or dislocation. Joint spaces are maintained. IMPRESSION: Bone fragment along the anterior distal talus appears well corticated and may be related to old avulsion injury. Recommend correlation for pain in this area to exclude acute avulsion. Electronically Signed   By: Charlett Nose M.D.   On: 01/16/2021 20:13      Allergies  Allergen Reactions   Latex Anaphylaxis    History reviewed. No pertinent past medical history. Social History   Socioeconomic History   Marital status: Divorced    Spouse name: Not on file   Number of children: Not on file   Years of education: Not on file   Highest education level: Not on file  Occupational History   Not on file  Tobacco Use   Smoking status: Never   Smokeless tobacco: Never  Substance and Sexual Activity   Alcohol use: No   Drug use: No   Sexual activity: Never  Other Topics Concern   Not on file  Social History Narrative   Not on file   Social Determinants of Health   Financial Resource Strain: Not on file  Food Insecurity: Not on file  Transportation Needs: Not on file  Physical Activity: Not on file  Stress: Not on file  Social Connections: Not on file   History reviewed. No pertinent family history. Past Surgical  History:  Procedure Laterality Date   KNEE SURGERY  2012       Mardella Layman, MD 01/17/21 [phone]

## 2021-01-22 ENCOUNTER — Telehealth: Payer: Self-pay | Admitting: Podiatry

## 2021-01-22 NOTE — Telephone Encounter (Signed)
Patient called this morning, she is waiting on someone to call her with an appointment for a MRI ? She is willing to go anywhere if she can get in soon to have it done. No orders are in for MRI  , please advise.

## 2021-01-23 NOTE — Telephone Encounter (Signed)
Order placed for MRI left ankle at [facility]. Please notify patient. Hopefully they will be calling shortly for the appt to arrange an MRI. - Dr. Logan Bores

## 2021-01-24 ENCOUNTER — Ambulatory Visit (HOSPITAL_COMMUNITY)
Admission: EM | Admit: 2021-01-24 | Discharge: 2021-01-24 | Disposition: A | Discharge status: Home / Self Care | Payer: BLUE CROSS/BLUE SHIELD | Attending: Family Medicine | Admitting: Family Medicine

## 2021-01-24 ENCOUNTER — Other Ambulatory Visit: Payer: Self-pay

## 2021-01-24 ENCOUNTER — Encounter (HOSPITAL_COMMUNITY): Payer: Self-pay

## 2021-01-24 ENCOUNTER — Other Ambulatory Visit: Payer: Self-pay | Admitting: Family Medicine

## 2021-01-24 DIAGNOSIS — J069 Acute upper respiratory infection, unspecified: Secondary | ICD-10-CM

## 2021-01-24 DIAGNOSIS — R52 Pain, unspecified: Secondary | ICD-10-CM

## 2021-01-24 DIAGNOSIS — R509 Fever, unspecified: Secondary | ICD-10-CM

## 2021-01-24 MED ORDER — PROMETHAZINE-DM 6.25-15 MG/5ML PO SYRP: 5.0000 mL | ORAL_SOLUTION | Freq: Four times a day (QID) | ORAL | 0 refills | Status: DC | PRN

## 2021-01-24 MED ORDER — ALBUTEROL SULFATE HFA 108 (90 BASE) MCG/ACT IN AERS: 1.0000 | INHALATION_SPRAY | Freq: Four times a day (QID) | RESPIRATORY_TRACT | 0 refills | Status: DC | PRN

## 2021-01-24 NOTE — Telephone Encounter (Signed)
Called patient and put her through to [facility] to schedule testing -patient is scheduled for December 10th 340p

## 2021-01-24 NOTE — ED Triage Notes (Signed)
Pt presents with productive cough and chest congestion that causes chest discomfort X 3 days.

## 2021-01-24 NOTE — ED Provider Notes (Signed)
[facility]    CSN: [id number] Arrival date & time: 01/24/21  0086      History   Chief Complaint Chief Complaint  Patient presents with   URI    HPI Anna Vargas is a 30 y.o. female.   Presenting today with 3-day history of fever, chills, body aches, productive cough with pleuritic chest pain, fatigue, sore throat.  Denies chest pain, significant shortness of breath, wheezing, abdominal pain, nausea vomiting or diarrhea.  So far taking over-the-counter pain and fever reducers, cough medications with minimal relief.  No known pertinent past medical problems.  Multiple sick contacts recently.   History reviewed. No pertinent past medical history.  There are no problems to display for this patient.   Past Surgical History:  Procedure Laterality Date   KNEE SURGERY  2012    OB History   No obstetric history on file.      Home Medications    Prior to Admission medications   Medication Sig Start Date End Date Taking? Authorizing Provider  albuterol (VENTOLIN HFA) 108 (90 Base) MCG/ACT inhaler Inhale 1-2 puffs into the lungs every 6 (six) hours as needed for wheezing or shortness of breath. 01/24/21  Yes Particia Nearing, PA-C  promethazine-dextromethorphan (PROMETHAZINE-DM) 6.25-15 MG/5ML syrup Take 5 mLs by mouth 4 (four) times daily as needed. 01/24/21  Yes Particia Nearing, PA-C  ALPRAZolam Prudy Feeler) 0.5 MG tablet Take 0.5 mg by mouth as needed. 10/10/20   [provider]  buPROPion (WELLBUTRIN XL) 300 MG 24 hr tablet Take 300 mg by mouth every morning. 08/11/20   [provider]  chlorpheniramine-HYDROcodone (TUSSIONEX PENNKINETIC ER) 10-8 MG/5ML LQCR Take 5 mLs by mouth every 12 (twelve) hours as needed. 04/06/13   Carmelina Dane, MD  citalopram (CELEXA) 40 MG tablet Take 40 mg by mouth at bedtime. 08/11/20   [provider]  levonorgestrel (MIRENA) 20 MCG/DAY IUD by Intrauterine route.    [provider]   methylphenidate 54 MG PO CR tablet Take 54 mg by mouth every morning. 10/10/20   [provider]  oseltamivir (TAMIFLU) 75 MG capsule Take 1 capsule (75 mg total) by mouth 2 (two) times daily. 04/06/13   Carmelina Dane, MD  REXULTI 2 MG TABS tablet Take 2 mg by mouth every morning. 10/04/20   [provider]  VYVANSE 50 MG capsule Take 50 mg by mouth every morning. 12/13/20   [provider]    Family History History reviewed. No pertinent family history.  Social History Social History   Tobacco Use   Smoking status: Never   Smokeless tobacco: Never  Substance Use Topics   Alcohol use: No   Drug use: No   Allergies   Latex  Review of Systems Review of Systems Per HPI  Physical Exam Triage Vital Signs ED Triage Vitals  Enc Vitals Group     BP 01/24/21 0836 129/78     Pulse Rate 01/24/21 0838 (!) 137     Resp 01/24/21 0836 18     Temp 01/24/21 0836 98.3 F (36.8 C)     Temp Source 01/24/21 0836 Oral     SpO2 01/24/21 0836 98 %     Weight --      Height --      Head Circumference --      Peak Flow --      Pain Score 01/24/21 0838 3     Pain Loc --      Pain  Edu? --      Excl. in [place]? --    No data found.  Updated Vital Signs BP 129/78 (BP Location: Right Arm)   Pulse (!) 137   Temp 98.3 F (36.8 C) (Oral)   Resp 18   LMP  (LMP Unknown)   SpO2 98%   Visual Acuity Right Eye Distance:   Left Eye Distance:   Bilateral Distance:    Right Eye Near:   Left Eye Near:    Bilateral Near:     Physical Exam Vitals and nursing note reviewed.  Constitutional:      Appearance: Normal appearance.  HENT:     Head: Atraumatic.     Right Ear: Tympanic membrane and external ear normal.     Left Ear: Tympanic membrane and external ear normal.     Nose: Rhinorrhea present.     Mouth/Throat:     Mouth: Mucous membranes are moist.     Pharynx: Posterior oropharyngeal erythema present.  Eyes:     Extraocular Movements: Extraocular  movements intact.     Conjunctiva/sclera: Conjunctivae normal.  Cardiovascular:     Rate and Rhythm: Normal rate and regular rhythm.     Heart sounds: Normal heart sounds.  Pulmonary:     Effort: Pulmonary effort is normal.     Breath sounds: Normal breath sounds. No wheezing or rales.  Musculoskeletal:        General: Normal range of motion.     Cervical back: Normal range of motion and neck supple.  Skin:    General: Skin is warm and dry.  Neurological:     Mental Status: She is alert and oriented to person, place, and time.     Motor: No weakness.     Gait: Gait normal.  Psychiatric:        Mood and Affect: Mood normal.        Thought Content: Thought content normal.   UC Treatments / Results  Labs (all labs ordered are listed, but only abnormal results are displayed) Labs Reviewed - No data to display  EKG   Radiology No results found.  Procedures Procedures (including critical care time)  Medications Ordered in UC Medications - No data to display  Initial Impression / Assessment and Plan / UC Course  I have reviewed the triage vital signs and the nursing notes.  Pertinent labs & imaging results that were available during my care of the patient were reviewed by me and considered in my medical decision making (see chart for details).     Strongly suspect influenza, out of ideal window for initiation of Tamiflu so we will forego this at this time.  Treat with Phenergan, albuterol inhaler for her hacking cough, chest tightness and discussed over-the-counter pain and fever reducers, over-the-counter medications for cold and congestion.  Work note given.  She declines viral testing which is reasonable at this time.  Final Clinical Impressions(s) / UC Diagnoses   Final diagnoses:  Viral URI with cough  Fever, unspecified  Generalized body aches   Discharge Instructions   None    ED Prescriptions     Medication Sig Dispense Auth. Provider    promethazine-dextromethorphan (PROMETHAZINE-DM) 6.25-15 MG/5ML syrup Take 5 mLs by mouth 4 (four) times daily as needed. 100 mL Volney American, PA-C   albuterol (VENTOLIN HFA) 108 (90 Base) MCG/ACT inhaler Inhale 1-2 puffs into the lungs every 6 (six) hours as needed for wheezing or shortness of breath. [place],  PA-C      PDMP not reviewed this encounter.   Volney American, [place] 01/24/21 1341

## 2021-01-24 NOTE — Telephone Encounter (Signed)
Requested Prescriptions  Refused Prescriptions Disp Refills  . albuterol (VENTOLIN HFA) 108 (90 Base) MCG/ACT inhaler [Pharmacy Med Name: ALBUTEROL HFA INH(200 PUFFS)18GM] 54 g     Sig: INHALE 1 TO 2 PUFFS INTO THE LUNGS EVERY 6 HOURS AS NEEDED FOR WHEEZING OR SHORTNESS OF BREATH     Pulmonology:  Beta Agonists Failed - 01/24/2021 10:24 AM      Failed - One inhaler should last at least one month. If the patient is requesting refills earlier, contact the patient to check for uncontrolled symptoms.      Failed - Valid encounter within last 12 months    Recent Outpatient Visits          7 years ago Influenza with other respiratory manifestations   [facility], Tessa Lerner, MD   7 years ago Sore throat   [facility], Levell July, MD

## 2021-01-26 NOTE — Progress Notes (Signed)
   HPI: 30 y.o. female presenting today as a new patient for evaluation of an ankle sprain that the patient sustained on 01/16/2021.  Patient states that she slipped and twisted her foot.  When she fell she heard a "pop" to her left ankle.  She went to the emergency department where x-rays were taken and she was diagnosed with possible fracture of the talus.  She was placed in the cam boot and instructed to follow-up in the office.  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  No past medical history on file.   Physical Exam: General: The patient is alert and oriented x3 in no acute distress.  Dermatology: Skin is warm, dry and supple bilateral lower extremities. Negative for open lesions or macerations.  Vascular: Palpable pedal pulses bilaterally. No edema or erythema noted. Capillary refill within normal limits.  Neurological: Epicritic and protective threshold grossly intact bilaterally.   Musculoskeletal Exam: Pain on light palpation throughout the left ankle especially the lateral portion.  Radiographic Exam 01/16/2021 urgent care:  FINDINGS: Small bone fragment noted along the anterior aspect of the distal talus appears well corticated and may be related to old injury. No additional acute fracture, subluxation or dislocation. Joint spaces are maintained.   IMPRESSION: Bone fragment along the anterior distal talus appears well corticated and may be related to old avulsion injury. Recommend correlation for pain in this area to exclude acute avulsion.      Assessment: 1.  Left ankle sprain at home 01/16/2021   Plan of Care:  1. Patient evaluated. X-Rays reviewed that were taken at the urgent care.  2.  The small bone fragment noted on x-ray clinically seems to be an incidental finding unrelated to the patient's acute injury. 3.  Today were  going to order MRI left ankle 4.  Continue cam boot.  Weightbearing as tolerated 5.  A note for work was provided to refrain from any physical activity and rest as needed 6.  In the meantime recommend rest ice compression and elevation is much as reasonably possible 7.  Return to clinic after the MRI to review the results and discuss further treatment options  *Works at [employer]. [facility]      Felecia Shelling, DPM [facility]  Dr. Felecia Shelling, DPM    [address] [place], [place] [address]                Office [phone]  Fax [phone]

## 2021-02-01 DIAGNOSIS — R051 Acute cough: Secondary | ICD-10-CM | POA: Diagnosis not present

## 2021-02-01 DIAGNOSIS — J209 Acute bronchitis, unspecified: Secondary | ICD-10-CM | POA: Diagnosis not present

## 2021-02-10 ENCOUNTER — Other Ambulatory Visit: Payer: BLUE CROSS/BLUE SHIELD

## 2021-02-10 ENCOUNTER — Encounter (HOSPITAL_BASED_OUTPATIENT_CLINIC_OR_DEPARTMENT_OTHER): Payer: Self-pay

## 2021-02-10 ENCOUNTER — Emergency Department (HOSPITAL_BASED_OUTPATIENT_CLINIC_OR_DEPARTMENT_OTHER): Payer: BLUE CROSS/BLUE SHIELD

## 2021-02-10 ENCOUNTER — Other Ambulatory Visit: Payer: Self-pay

## 2021-02-10 ENCOUNTER — Emergency Department (HOSPITAL_BASED_OUTPATIENT_CLINIC_OR_DEPARTMENT_OTHER)
Admission: EM | Admit: 2021-02-10 | Discharge: 2021-02-10 | Disposition: A | Discharge status: Home / Self Care | Payer: BLUE CROSS/BLUE SHIELD | Attending: Emergency Medicine | Admitting: Emergency Medicine

## 2021-02-10 DIAGNOSIS — J189 Pneumonia, unspecified organism: Secondary | ICD-10-CM | POA: Diagnosis not present

## 2021-02-10 DIAGNOSIS — R0602 Shortness of breath: Secondary | ICD-10-CM | POA: Diagnosis not present

## 2021-02-10 DIAGNOSIS — R059 Cough, unspecified: Secondary | ICD-10-CM | POA: Diagnosis not present

## 2021-02-10 DIAGNOSIS — Z91040 Latex allergy status: Secondary | ICD-10-CM | POA: Insufficient documentation

## 2021-02-10 DIAGNOSIS — R051 Acute cough: Secondary | ICD-10-CM | POA: Diagnosis not present

## 2021-02-10 LAB — CBC WITH DIFFERENTIAL/PLATELET
Abs Immature Granulocytes: 0.06 10*3/uL (ref 0.00–0.07)
Basophils Absolute: 0.1 10*3/uL (ref 0.0–0.1)
Basophils Relative: 1 %
Eosinophils Absolute: 0 10*3/uL (ref 0.0–0.5)
Eosinophils Relative: 0 %
HCT: 42.2 % (ref 36.0–46.0)
Hemoglobin: 14.3 g/dL (ref 12.0–15.0)
Immature Granulocytes: 1 %
Lymphocytes Relative: 7 %
Lymphs Abs: 0.8 10*3/uL (ref 0.7–4.0)
MCH: 28.8 pg (ref 26.0–34.0)
MCHC: 33.9 g/dL (ref 30.0–36.0)
MCV: 85.1 fL (ref 80.0–100.0)
Monocytes Absolute: 0.6 10*3/uL (ref 0.1–1.0)
Monocytes Relative: 5 %
Neutro Abs: 10.5 10*3/uL — ABNORMAL HIGH (ref 1.7–7.7)
Neutrophils Relative %: 86 %
Platelets: 331 10*3/uL (ref 150–400)
RBC: 4.96 MIL/uL (ref 3.87–5.11)
RDW: 12.7 % (ref 11.5–15.5)
WBC: 12.1 10*3/uL — ABNORMAL HIGH (ref 4.0–10.5)
nRBC: 0 % (ref 0.0–0.2)

## 2021-02-10 LAB — COMPREHENSIVE METABOLIC PANEL
ALT: 18 U/L (ref 0–44)
AST: 18 U/L (ref 15–41)
Albumin: 4.5 g/dL (ref 3.5–5.0)
Alkaline Phosphatase: 80 U/L (ref 38–126)
Anion gap: 10 (ref 5–15)
BUN: 7 mg/dL (ref 6–20)
CO2: 23 mmol/L (ref 22–32)
Calcium: 9.5 mg/dL (ref 8.9–10.3)
Chloride: 102 mmol/L (ref 98–111)
Creatinine, Ser: 0.84 mg/dL (ref 0.44–1.00)
GFR, Estimated: >60 mL/min (ref >60)
Glucose, Bld: 130 mg/dL — ABNORMAL HIGH (ref 70–99)
Potassium: 4.2 mmol/L (ref 3.5–5.1)
Sodium: 135 mmol/L (ref 135–145)
Total Bilirubin: 0.4 mg/dL (ref 0.3–1.2)
Total Protein: 8 g/dL (ref 6.5–8.1)

## 2021-02-10 LAB — LACTIC ACID, PLASMA: Lactic Acid, Venous: 1.1 mmol/L (ref 0.5–1.9)

## 2021-02-10 MED ORDER — ALBUTEROL SULFATE HFA 108 (90 BASE) MCG/ACT IN AERS: 2.0000 | INHALATION_SPRAY | RESPIRATORY_TRACT | Status: DC | PRN

## 2021-02-10 MED ORDER — IPRATROPIUM-ALBUTEROL 0.5-2.5 (3) MG/3ML IN SOLN
3.0000 mL | Freq: Once | RESPIRATORY_TRACT | Status: AC
  Administered 2021-02-10: 3 mL via RESPIRATORY_TRACT

## 2021-02-10 MED ORDER — SODIUM CHLORIDE 0.9 % IV BOLUS
1000.0000 mL | Freq: Once | INTRAVENOUS | Status: AC
  Administered 2021-02-10: 1000 mL via INTRAVENOUS

## 2021-02-10 MED ORDER — ALBUTEROL SULFATE (2.5 MG/3ML) 0.083% IN NEBU: 5.0000 mg | INHALATION_SOLUTION | Freq: Once | RESPIRATORY_TRACT | Status: DC

## 2021-02-10 MED ORDER — IPRATROPIUM-ALBUTEROL 0.5-2.5 (3) MG/3ML IN SOLN
RESPIRATORY_TRACT | Status: AC
  Administered 2021-02-10: 3 mL via RESPIRATORY_TRACT
  Filled 2021-02-10: qty 3

## 2021-02-10 MED ORDER — IPRATROPIUM BROMIDE 0.02 % IN SOLN: 0.5000 mg | Freq: Once | RESPIRATORY_TRACT | Status: DC

## 2021-02-10 MED ORDER — DEXAMETHASONE SODIUM PHOSPHATE 10 MG/ML IJ SOLN
10.0000 mg | Freq: Once | INTRAMUSCULAR | Status: AC
  Administered 2021-02-10: 10 mg via INTRAVENOUS
  Filled 2021-02-10: qty 1

## 2021-02-10 MED ORDER — CEFDINIR 300 MG PO CAPS: 300.0000 mg | ORAL_CAPSULE | Freq: Two times a day (BID) | ORAL | 0 refills | Status: AC

## 2021-02-10 MED ORDER — HYDROCOD POLST-CPM POLST ER 10-8 MG/5ML PO SUER: 5.0000 mL | Freq: Two times a day (BID) | ORAL | 0 refills | Status: DC

## 2021-02-10 MED ORDER — SODIUM CHLORIDE 0.9 % IV SOLN
2.0000 g | Freq: Once | INTRAVENOUS | Status: AC
  Administered 2021-02-10: 2 g via INTRAVENOUS
  Filled 2021-02-10: qty 20

## 2021-02-10 NOTE — ED Triage Notes (Signed)
Pt presents with cough, congestion and fever x24 hours.  Pt reports she has been treated for flu, bronchitis and pneumonia since thanksgiving. Pt was seen at [facility] today received nebulizer treatment, steroids and Augmentin and Z-pack. Pt tested negative for flu and covid today.

## 2021-02-10 NOTE — ED Provider Notes (Signed)
[facility] EMERGENCY DEPT Provider Note   CSN: [id number] Arrival date & time: 02/10/21  1929     History Chief Complaint  Patient presents with   Cough   Nasal Congestion    Anna Vargas is a 30 y.o. female.  Patient to ED for evaluation of persistent cough, fever, congestion that started over the Thanksgiving Holiday. She was diagnosed with the flu at that time. One week later, she was diagnosed with pneumonia and bronchitis by CXR and started on Zithromax and Augmentin. She felt better for several days, then her cough again became worse and her fever returned yesterday. She states she finishes the Augmentin tomorrow. She has chest pain with coughing and feels short of breath. She is using Tessalon and Albuterol inhaler with temporary but decreasing relief of cough. She feels she is drinking enough to stay hydrated but has no appetite. She was seen again at Urgent Care this morning and reports a negative COVID/influenza swab was done.  The history is provided by the patient. No language interpreter was used.  Cough Associated symptoms: chest pain (with cough), chills, fever and shortness of breath       History reviewed. No pertinent past medical history.  There are no problems to display for this patient.   Past Surgical History:  Procedure Laterality Date   KNEE SURGERY  2012     OB History   No obstetric history on file.     History reviewed. No pertinent family history.  Social History   Tobacco Use   Smoking status: Never   Smokeless tobacco: Never  Substance Use Topics   Alcohol use: No   Drug use: No    Home Medications Prior to Admission medications   Medication Sig Start Date End Date Taking? Authorizing Provider  albuterol (VENTOLIN HFA) 108 (90 Base) MCG/ACT inhaler Inhale 1-2 puffs into the lungs every 6 (six) hours as needed for wheezing or shortness of breath. 01/24/21   Particia Nearing, PA-C  ALPRAZolam Prudy Feeler) 0.5 MG  tablet Take 0.5 mg by mouth as needed. 10/10/20   [provider]  buPROPion (WELLBUTRIN XL) 300 MG 24 hr tablet Take 300 mg by mouth every morning. 08/11/20   [provider]  chlorpheniramine-HYDROcodone (TUSSIONEX PENNKINETIC ER) 10-8 MG/5ML LQCR Take 5 mLs by mouth every 12 (twelve) hours as needed. 04/06/13   Carmelina Dane, MD  citalopram (CELEXA) 40 MG tablet Take 40 mg by mouth at bedtime. 08/11/20   [provider]  levonorgestrel (MIRENA) 20 MCG/DAY IUD by Intrauterine route.    [provider]  methylphenidate 54 MG PO CR tablet Take 54 mg by mouth every morning. 10/10/20   [provider]  oseltamivir (TAMIFLU) 75 MG capsule Take 1 capsule (75 mg total) by mouth 2 (two) times daily. 04/06/13   Carmelina Dane, MD  promethazine-dextromethorphan (PROMETHAZINE-DM) 6.25-15 MG/5ML syrup Take 5 mLs by mouth 4 (four) times daily as needed. 01/24/21   Particia Nearing, PA-C  REXULTI 2 MG TABS tablet Take 2 mg by mouth every morning. 10/04/20   [provider]  VYVANSE 50 MG capsule Take 50 mg by mouth every morning. 12/13/20   [provider]    Allergies    Latex  Review of Systems   Review of Systems  Constitutional:  Positive for appetite change, chills and fever.  HENT:  Positive for congestion.   Respiratory:  Positive for cough and shortness of breath.   Cardiovascular:  Positive for  chest pain (with cough).  Gastrointestinal:  Positive for vomiting (Post-tussive only).  Genitourinary:  Negative for decreased urine volume.  Musculoskeletal: Negative.  Negative for neck stiffness.  Skin: Negative.   Neurological: Negative.    Physical Exam Updated Vital Signs BP (!) 149/95   Pulse (!) 114   Temp 98.3 F (36.8 C)   Resp 18   LMP  (LMP Unknown)   SpO2 98%   Physical Exam Vitals reviewed.  Constitutional:      Appearance: She is well-developed.  HENT:     Head: Normocephalic.     Mouth/Throat:      Mouth: Mucous membranes are moist.  Cardiovascular:     Rate and Rhythm: Regular rhythm. Tachycardia present.     Heart sounds: No murmur heard. Pulmonary:     Effort: Pulmonary effort is normal.     Breath sounds: Normal breath sounds. No wheezing, rhonchi or rales.     Comments: Actively coughing Abdominal:     General: Bowel sounds are normal.     Palpations: Abdomen is soft.     Tenderness: There is no abdominal tenderness. There is no guarding or rebound.  Musculoskeletal:        General: Normal range of motion.     Cervical back: Normal range of motion and neck supple.  Skin:    General: Skin is warm and dry.  Neurological:     General: No focal deficit present.     Mental Status: She is alert and oriented to person, place, and time.    ED Results / Procedures / Treatments   Labs (all labs ordered are listed, but only abnormal results are displayed) Labs Reviewed  CBC WITH DIFFERENTIAL/PLATELET  LACTIC ACID, PLASMA  LACTIC ACID, PLASMA  COMPREHENSIVE METABOLIC PANEL    EKG None  Radiology DG Chest Portable 1 View  Result Date: 02/10/2021 CLINICAL DATA:  Shortness of breath and cough. EXAM: PORTABLE CHEST 1 VIEW COMPARISON:  Chest radiograph dated 08/09/2018 FINDINGS: There is a focal area of airspace opacity in the right mid lung field as well as probable smaller ill-defined opacities involving the lower lung fields bilaterally. Findings most consistent with pneumonia. Clinical correlation and follow-up to resolution recommended. No pleural effusion pneumothorax. The cardiac silhouette is within normal limits. No acute osseous pathology. IMPRESSION: Findings most consistent with pneumonia. Electronically Signed   By: Elgie Collard M.D.   On: 02/10/2021 20:15    Procedures Procedures   Medications Ordered in ED Medications  sodium chloride 0.9 % bolus 1,000 mL (has no administration in time range)  cefTRIAXone (ROCEPHIN) 2 g in sodium chloride 0.9 % 100 mL IVPB  (has no administration in time range)  albuterol (PROVENTIL) (2.5 MG/3ML) 0.083% nebulizer solution 5 mg (has no administration in time range)  ipratropium (ATROVENT) nebulizer solution 0.5 mg (has no administration in time range)  ipratropium-albuterol (DUONEB) 0.5-2.5 (3) MG/3ML nebulizer solution 3 mL (3 mLs Nebulization Given 02/10/21 2042)    ED Course  I have reviewed the triage vital signs and the nursing notes.  Pertinent labs & imaging results that were available during my care of the patient were reviewed by me and considered in my medical decision making (see chart for details).    MDM Rules/Calculators/A&P                           Patient to ED with persistent, now worsening, symptoms of URI. Initially diagnosed with the flu over  Thanksgiving, then pneumonia, brief improvement before worsening again with uncontrolled cough and fever.   CXR show persistent pneumonia. She is tachycardic on arrival. No fever. Will do labs, IV fluids. Discussed patient's course with pharmacy who advises IV Rocephin 2 gms. If/when discharged home, recommends cefdinir.    Final Clinical Impression(s) / ED Diagnoses Final diagnoses:  None   CAP  Rx / DC Orders ED Discharge Orders     None        Danne Harbor 02/10/21 2150    Terrilee Files, MD 02/11/21 1014

## 2021-02-10 NOTE — Discharge Instructions (Addendum)
Continue the use of Albuterol every 4 hours as needed for cough. Take the new antibiotic twice daily for another 10 days. We prescribed a different type of cough medication that may be more effective. Use this (Tussionex) twice daily.   Return to the ED with any worsening symptoms.

## 2021-02-13 ENCOUNTER — Ambulatory Visit: Payer: BLUE CROSS/BLUE SHIELD | Admitting: Podiatry

## 2021-02-14 ENCOUNTER — Ambulatory Visit: Payer: BLUE CROSS/BLUE SHIELD | Admitting: Podiatry

## 2021-02-15 ENCOUNTER — Other Ambulatory Visit: Payer: Self-pay | Admitting: Family Medicine

## 2021-02-15 NOTE — Telephone Encounter (Signed)
Notes to clinic:  prescriber not in this practice, requesting early, please assess.      Requested Prescriptions  Pending Prescriptions Disp Refills   albuterol (VENTOLIN HFA) 108 (90 Base) MCG/ACT inhaler [Pharmacy Med Name: ALBUTEROL HFA INH(200 PUFFS)18GM] 18 g 0    Sig: INHALE 1 TO 2 PUFFS INTO THE LUNGS EVERY 6 HOURS AS NEEDED FOR WHEEZING OR SHORTNESS OF BREATH     Pulmonology:  Beta Agonists Failed - 02/15/2021  3:45 AM      Failed - One inhaler should last at least one month. If the patient is requesting refills earlier, contact the patient to check for uncontrolled symptoms.      Failed - Valid encounter within last 12 months    Recent Outpatient Visits           7 years ago Influenza with other respiratory manifestations   [facility], Tessa Lerner, MD   8 years ago Sore throat   [facility], Levell July, MD       Future Appointments             In 1 month Early, Sung Amabile, NP [facility]

## 2021-03-07 ENCOUNTER — Other Ambulatory Visit: Payer: Self-pay

## 2021-03-07 ENCOUNTER — Ambulatory Visit
Admission: RE | Admit: 2021-03-07 | Discharge: 2021-03-07 | Disposition: A | Discharge status: Home / Self Care | Payer: BLUE CROSS/BLUE SHIELD | Source: Ambulatory Visit | Attending: Podiatry | Admitting: Podiatry

## 2021-03-07 DIAGNOSIS — S93402A Sprain of unspecified ligament of left ankle, initial encounter: Secondary | ICD-10-CM

## 2021-03-07 DIAGNOSIS — F3341 Major depressive disorder, recurrent, in partial remission: Secondary | ICD-10-CM | POA: Diagnosis not present

## 2021-03-07 DIAGNOSIS — Z9889 Other specified postprocedural states: Secondary | ICD-10-CM | POA: Diagnosis not present

## 2021-03-07 DIAGNOSIS — F411 Generalized anxiety disorder: Secondary | ICD-10-CM | POA: Diagnosis not present

## 2021-03-07 DIAGNOSIS — M25572 Pain in left ankle and joints of left foot: Secondary | ICD-10-CM | POA: Diagnosis not present

## 2021-03-12 ENCOUNTER — Ambulatory Visit: Payer: Self-pay | Admitting: Podiatry

## 2021-03-19 ENCOUNTER — Other Ambulatory Visit: Payer: Self-pay

## 2021-03-19 ENCOUNTER — Ambulatory Visit: Payer: BLUE CROSS/BLUE SHIELD | Admitting: Podiatry

## 2021-03-19 DIAGNOSIS — M76822 Posterior tibial tendinitis, left leg: Secondary | ICD-10-CM

## 2021-03-19 MED ORDER — BETAMETHASONE SOD PHOS & ACET 6 (3-3) MG/ML IJ SUSP
3.0000 mg | Freq: Once | INTRAMUSCULAR | Status: AC
  Administered 2021-03-19: 3 mg via INTRA_ARTICULAR

## 2021-03-19 MED ORDER — BETAMETHASONE SOD PHOS & ACET 6 (3-3) MG/ML IJ SUSP: 3.0000 mg | Freq: Once | INTRAMUSCULAR | Status: DC

## 2021-03-19 MED ORDER — MELOXICAM 15 MG PO TABS: 15.0000 mg | ORAL_TABLET | Freq: Every day | ORAL | 1 refills | Status: DC

## 2021-03-19 NOTE — Progress Notes (Signed)
° °  HPI: 31 y.o. female presenting today presenting for follow-up evaluation of an ankle sprain to the left ankle.  Patient does have a history of left posterior tibial tendon surgery 2019.  Patient states that some of the acute pain from the ankle sprain has resolved however she continues to have chronic pain associated to the left medial ankle ever since surgery.  She would like to discuss different treatment options  No past medical history on file.  Past Surgical History:  Procedure Laterality Date   KNEE SURGERY  2012    Allergies  Allergen Reactions   Latex Anaphylaxis     Physical Exam: General: The patient is alert and oriented x3 in no acute distress.  Dermatology: Skin is warm, dry and supple bilateral lower extremities. Negative for open lesions or macerations.  Vascular: Palpable pedal pulses bilaterally. Capillary refill within normal limits.  Negative for any significant edema or erythema  Neurological: Light touch and protective threshold grossly intact  Musculoskeletal Exam: No pedal deformities noted.  There continues to be some tenderness to palpation along the posterior tibial tendon just inferior to the medial malleolus  MR ANKLE LT WO CONTRAST 03/07/2021: IMPRESSION: 1. Postsurgical changes involving the PT tendon, with anchor in the navicular. Mild tendinosis of the inframalleolar PT tendon. No acute tendon tear. 2. Intact ankle ligaments.  No acute osseous abnormality.  Assessment: 1. H/o PT tendon surgery with anchor into the navicular.  2019 in [place] 2.  Posterior tibial tendinitis/tendinosis left   Plan of Care:  1. Patient evaluated.  MRI reviewed.  We did discuss different treatment options including custom molded orthotics, ankle bracing.  The patient states that she has tried all of these modalities.  She has not taken any oral anti-inflammatory for the symptoms. 2.  Injection of 0.5 cc Celestone Soluspan injected along the posterior tibial tendon  left just inferior to the medial malleolus 3.  Prescription for meloxicam 15 mg daily 4.  Continue OTC arch supports.  The patient has gone through 6 pairs of custom molded orthotics with no improvement previously 5.  Continue ankle brace as needed.  The patient has 3 different ankle braces that she has tried 6.  Return to clinic as needed  *Works at [employer]. [facility] [employer]      Felecia Shelling, DPM [facility]  Dr. Felecia Shelling, DPM    [address] [place], [place] [address]                Office [phone]  Fax [phone]

## 2021-03-19 NOTE — Progress Notes (Signed)
DG  °

## 2021-03-20 DIAGNOSIS — F411 Generalized anxiety disorder: Secondary | ICD-10-CM | POA: Diagnosis not present

## 2021-03-20 DIAGNOSIS — F3341 Major depressive disorder, recurrent, in partial remission: Secondary | ICD-10-CM | POA: Diagnosis not present

## 2021-03-27 ENCOUNTER — Ambulatory Visit (HOSPITAL_BASED_OUTPATIENT_CLINIC_OR_DEPARTMENT_OTHER): Payer: BLUE CROSS/BLUE SHIELD | Admitting: Nurse Practitioner

## 2021-04-03 ENCOUNTER — Other Ambulatory Visit: Payer: Self-pay

## 2021-04-03 ENCOUNTER — Encounter (HOSPITAL_BASED_OUTPATIENT_CLINIC_OR_DEPARTMENT_OTHER): Payer: Self-pay | Admitting: Nurse Practitioner

## 2021-04-03 ENCOUNTER — Ambulatory Visit (HOSPITAL_BASED_OUTPATIENT_CLINIC_OR_DEPARTMENT_OTHER): Payer: BLUE CROSS/BLUE SHIELD | Admitting: Nurse Practitioner

## 2021-04-03 VITALS — BP 117/72 | HR 99 | Resp 99 | Ht 64.0 in | Wt 231.6 lb

## 2021-04-03 DIAGNOSIS — Z23 Encounter for immunization: Secondary | ICD-10-CM

## 2021-04-03 DIAGNOSIS — F419 Anxiety disorder, unspecified: Secondary | ICD-10-CM | POA: Insufficient documentation

## 2021-04-03 DIAGNOSIS — R61 Generalized hyperhidrosis: Secondary | ICD-10-CM

## 2021-04-03 DIAGNOSIS — L709 Acne, unspecified: Secondary | ICD-10-CM | POA: Diagnosis not present

## 2021-04-03 DIAGNOSIS — F3342 Major depressive disorder, recurrent, in full remission: Secondary | ICD-10-CM

## 2021-04-03 DIAGNOSIS — Z780 Asymptomatic menopausal state: Secondary | ICD-10-CM | POA: Insufficient documentation

## 2021-04-03 DIAGNOSIS — M1712 Unilateral primary osteoarthritis, left knee: Secondary | ICD-10-CM

## 2021-04-03 DIAGNOSIS — E7800 Pure hypercholesterolemia, unspecified: Secondary | ICD-10-CM | POA: Insufficient documentation

## 2021-04-03 DIAGNOSIS — E559 Vitamin D deficiency, unspecified: Secondary | ICD-10-CM | POA: Insufficient documentation

## 2021-04-03 MED ORDER — SPIRONOLACTONE 25 MG PO TABS: 25.0000 mg | ORAL_TABLET | Freq: Every day | ORAL | 3 refills | Status: DC

## 2021-04-03 NOTE — Assessment & Plan Note (Signed)
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  No signs of infection or new injury present today. Recommend follow-up with Dr. Ihor Dow for further evaluation and recommendations regarding joint injection therapy.

## 2021-04-03 NOTE — Assessment & Plan Note (Signed)
Longstanding history of bipolar disorder predominantly depressive symptoms well controlled at this time.  Patient is currently seeking care from psychiatry and is happy with services and treatments provided. Recommend continue treatment with psychiatry and follow-up if symptoms worsen or new symptoms develop.

## 2021-04-03 NOTE — Assessment & Plan Note (Signed)
Symptoms and presentation consistent with hyperhidrosis. Patient has had complete work-up on this and tried and failed topical treatments. Recommend referral to dermatology for discussion and evaluation of treatment options that may be beneficial to her. Will send referral today.

## 2021-04-03 NOTE — Assessment & Plan Note (Signed)
Chronic acne not controlled with topical treatments. Recommendation for trial of spironolactone to see if this is helpful for patient. Recommend 25 mg once daily and will monitor for symptom improvement. Will also send referral to dermatology for further evaluation and recommendations as the patient is also experiencing hyperhidrosis. Discussions and warnings on monitoring for hypovolemia and low blood pressure while on medication. We will plan to follow-up in near future.

## 2021-04-03 NOTE — Patient Instructions (Signed)
Thank you for choosing [facility] for your Primary Care needs. I am excited for the opportunity to partner with you to meet your health care goals. It was a pleasure meeting you today!  Recommendations from today's visit: I sent in the referral to dermatology- they will call you to schedule this I sent in the spironolactone to try for acne and hyperhidrosis Judson Roch will help you make a follow-up to see Dr. Burnard Bunting for injection of the knee We will plan to see you later in the year  Information on diet, exercise, and health maintenance recommendations are listed below. This is information to help you be sure you are on track for optimal health and monitoring.   Please look over this and let us know if you have any questions or if you have completed any of the health maintenance outside of [place] so that we can be sure your records are up to date.  ___________________________________________________________ About Me: I am an Adult-Geriatric Nurse Practitioner with a background in caring for patients for more than 20 years with a strong intensive care background. I provide primary care and sports medicine services to patients age 60 and older within this office. My education had a strong focus on caring for the older adult population, which I am passionate about. I am also the director of the APP Fellowship with [facility].   My desire is to provide you with the best service through preventive medicine and supportive care. I consider you a part of the medical team and value your input. I work diligently to ensure that you are heard and your needs are met in a safe and effective manner. I want you to feel comfortable with me as your provider and want you to know that your health concerns are important to me.  For your information, our office hours are: Monday, Tuesday, and Thursday 8:00 AM - 5:00 PM Wednesday and Friday 8:00 AM - 12:00 PM.   In my time away  from the office I am teaching new APP's within the system and am unavailable, but my partner, Dr. Burnard Bunting is in the office for emergent needs.   If you have questions or concerns, please call our office at [phone] or send [place] a MyChart message and we will respond as quickly as possible.  ____________________________________________________________ MyChart:  For all urgent or time sensitive needs we ask that you please call the office to avoid delays. Our number is [phone]. MyChart is not constantly monitored and due to the large volume of messages a day, replies may take up to 72 business hours.  MyChart Policy: MyChart allows for you to see your visit notes, after visit summary, provider recommendations, lab and tests results, make an appointment, request refills, and contact your provider or the office for non-urgent questions or concerns. Providers are seeing patients during normal business hours and do not have built in time to review MyChart messages.  We ask that you allow a minimum of 3 business days for responses to [employer]. For this reason, please do not send urgent requests through [place]. Please call the office at [phone]. New and ongoing conditions may require a visit. We have virtual and in person visit available for your convenience.  Complex MyChart concerns may require a visit. Your provider may request you schedule a virtual or in person visit to ensure we are providing the best care possible. MyChart messages sent after 11:00 AM on Friday will not be  received by the provider until Monday morning.    Lab and Test Results: You will receive your lab and test results on MyChart as soon as they are completed and results have been sent by the lab or testing facility. Due to this service, you will receive your results BEFORE your provider.  I review lab and tests results each morning prior to seeing patients. Some results require collaboration with other providers  to ensure you are receiving the most appropriate care. For this reason, we ask that you please allow a minimum of 3-5 business days from the time the ALL results have been received for your provider to receive and review lab and test results and contact you about these.  Most lab and test result comments from the provider will be sent through [place]. Your provider may recommend changes to the plan of care, follow-up visits, repeat testing, ask questions, or request an office visit to discuss these results. You may reply directly to this message or call the office at [phone] to provide information for the provider or set up an appointment. In some instances, you will be called with test results and recommendations. Please let us know if this is preferred and we will make note of this in your chart to provide this for you.    If you have not heard a response to your lab or test results in 5 business days from all results returning to [place], please call the office to let us know. We ask that you please avoid calling prior to this time unless there is an emergent concern. Due to high call volumes, this can delay the resulting process.  After Hours: For all non-emergency after hours needs, please call the office at [phone] and select the option to reach the on-call provider service. On-call services are shared between multiple [facility] offices and therefore it will not be possible to speak directly with your provider. On-call providers may provide medical advice and recommendations, but are unable to provide refills for maintenance medications.  For all emergency or urgent medical needs after normal business hours, we recommend that you seek care at the closest Urgent Care or Emergency Department to ensure appropriate treatment in a timely manner.  [facility] has a 24 hour emergency room located on the ground floor for your convenience.   Urgent Concerns During the  Business Day Providers are seeing patients from 8AM to [place] with a busy schedule and are most often not able to respond to non-urgent calls until the end of the day or the next business day. If you should have URGENT concerns during the day, please call and speak to the nurse or schedule a same day appointment so that we can address your concern without delay.   Thank you, again, for choosing me as your health care partner. I appreciate your trust and look forward to learning more about you.   Anna Keeler, DNP, AGNP-c ___________________________________________________________  Health Maintenance Recommendations Screening Testing Mammogram Every 1 -2 years based on history and risk factors Starting at age 59 Pap Smear Ages 21-39 every 3 years Ages 71-65 every 5 years with HPV testing More frequent testing may be required based on results and history Colon Cancer Screening Every 1-10 years based on test performed, risk factors, and history Starting at age 49 Bone Density Screening Every 2-10 years based on history Starting at age 66 for women Recommendations for men differ based on medication usage, history, and risk factors AAA  Screening One time ultrasound Men 74-34 years old who have every smoked Lung Cancer Screening Low Dose Lung CT every 12 months Age 60-80 years with a 30 pack-year smoking history who still smoke or who have quit within the last 15 years  Screening Labs Routine  Labs: Complete Blood Count (CBC), Complete Metabolic Panel (CMP), Cholesterol (Lipid Panel) Every 6-12 months based on history and medications May be recommended more frequently based on current conditions or previous results Hemoglobin A1c Lab Every 3-12 months based on history and previous results Starting at age 10 or earlier with diagnosis of diabetes, high cholesterol, BMI >26, and/or risk factors Frequent monitoring for patients with diabetes to ensure blood sugar control Thyroid Panel  (TSH w/ T3 & T4) Every 6 months based on history, symptoms, and risk factors May be repeated more often if on medication HIV One time testing for all patients 86 and older May be repeated more frequently for patients with increased risk factors or exposure Hepatitis C One time testing for all patients 65 and older May be repeated more frequently for patients with increased risk factors or exposure Gonorrhea, Chlamydia Every 12 months for all sexually active persons 13-24 years Additional monitoring may be recommended for those who are considered high risk or who have symptoms PSA Men 46-53 years old with risk factors Additional screening may be recommended from age 79-69 based on risk factors, symptoms, and history  Vaccine Recommendations Tetanus Booster All adults every 10 years Flu Vaccine All patients 6 months and older every year COVID Vaccine All patients 12 years and older Initial dosing with booster May recommend additional booster based on age and health history HPV Vaccine 2 doses all patients age 13-26 Dosing may be considered for patients over 26 Shingles Vaccine (Shingrix) 2 doses all adults 18 years and older Pneumonia (Pneumovax 23) All adults 73 years and older May recommend earlier dosing based on health history Pneumonia (Prevnar 26) All adults 67 years and older Dosed 1 year after Pneumovax 23  Additional Screening, Testing, and Vaccinations may be recommended on an individualized basis based on family history, health history, risk factors, and/or exposure.  __________________________________________________________  Diet Recommendations for All Patients  I recommend that all patients maintain a diet low in saturated fats, carbohydrates, and cholesterol. While this can be challenging at first, it is not impossible and small changes can make big differences.  Things to try: Decreasing the amount of soda, sweet tea, and/or juice to one or less per day and  replace with water While water is always the first choice, if you do not like water you may consider adding a water additive without sugar to improve the taste other sugar free drinks Replace potatoes with a brightly colored vegetable at dinner Use healthy oils, such as canola oil or olive oil, instead of butter or hard margarine Limit your bread intake to two pieces or less a day Replace regular pasta with low carb pasta options Bake, broil, or grill foods instead of frying Monitor portion sizes  Eat smaller, more frequent meals throughout the day instead of large meals  An important thing to remember is, if you love foods that are not great for your health, you don't have to give them up completely. Instead, allow these foods to be a reward when you have done well. Allowing yourself to still have special treats every once in a while is a nice way to tell yourself thank you for working hard to keep yourself healthy.  Also remember that every day is a new day. If you have a bad day and "fall off the wagon", you can still climb right back up and keep moving along on your journey!  We have resources available to help you!  Some websites that may be helpful include: www.http://carter.biz/  Www.VeryWellFit.com _____________________________________________________________  Activity Recommendations for All Patients  I recommend that all adults get at least 20 minutes of moderate physical activity that elevates your heart rate at least 5 days out of the week.  Some examples include: Walking or jogging at a pace that allows you to carry on a conversation Cycling (stationary bike or outdoors) Water aerobics Yoga Weight lifting Dancing If physical limitations prevent you from putting stress on your joints, exercise in a pool or seated in a chair are excellent options.  Do determine your MAXIMUM heart rate for activity: YOUR AGE - 220 = MAX HeartRate   Remember! Do not push yourself too hard.   Start slowly and build up your pace, speed, weight, time in exercise, etc.  Allow your body to rest between exercise and get good sleep. You will need more water than normal when you are exerting yourself. Do not wait until you are thirsty to drink. Drink with a purpose of getting in at least 8, 8 ounce glasses of water a day plus more depending on how much you exercise and sweat.    If you begin to develop dizziness, chest pain, abdominal pain, jaw pain, shortness of breath, headache, vision changes, lightheadedness, or other concerning symptoms, stop the activity and allow your body to rest. If your symptoms are severe, seek emergency evaluation immediately. If your symptoms are concerning, but not severe, please let us know so that we can recommend further evaluation.

## 2021-04-03 NOTE — Assessment & Plan Note (Addendum)
BMI 39.75 today. Recommendations for diet and activity provided on AVS. We will plan to monitor labs and evaluate for possible conditions that could be contributing in near future.

## 2021-04-03 NOTE — Progress Notes (Signed)
Anna Render, DNP, AGNP-c Primary Care & Sports Medicine [address]   [place], [place] [address] [phone]  New patient visit   Patient: Anna Vargas   DOB: 1990/11/01   31 y.o. Female  MRN: [id number] Visit Date: 04/03/2021  Patient Care Team: Audel Coakley, Anna Pesa, Anna Vargas as PCP - General (Nurse Practitioner)  Today's healthcare provider: Orma Render, Anna Vargas   Chief Complaint  Patient presents with   New Patient (Initial Visit)    Patient presents to the office today to a establish care. She has a couple of concerns. Acne and excessive sweating.    Subjective    Anna Vargas is a 31 y.o. female who presents today as a new patient to establish care.    Patient endorses the following concerns presently: Excessive sweating, acne, knee pain.  Hyperhidrosis Patient endorses symptoms of hyperhidrosis with increased sweating "all over".  She reports the symptoms have been present for the majority of her life.  In the past she has tried prescription antiperspirants which did not work well for her.  She reports this is a concern that runs in her family.  She has done some research and would like to discuss anticholinergic medication for treatment.  Acne She endorses concerns with frequent and chronic exacerbations of acne.  She reports painful nodular pustules that form on the face.  She has tried over-the-counter treatments in the past without success.  She is interested in discussing the option of medication management for this.  Knee pain She endorses left knee pain that has been ongoing for the past 10 to 15 years.  She endorses previous history of surgical intervention on this knee x2.  She has been told that she has "severe arthritis" in the knee.  At this time she is experiencing pain with movement and mobility of the knee.  She is not experiencing any significant decrease in mobility or inability to bear weight however the pain is significant.  In the  past she has had successful steroid injection in her heel for injury and she would like to discuss this option today.  History reviewed and reveals the following: History reviewed. No pertinent past medical history. Past Surgical History:  Procedure Laterality Date   KNEE SURGERY  2012   Family Status  Relation Name Status   Mother  Alive   Father  Alive   Brother  Alive   MGM  Deceased   MGF  Deceased   PGM  Alive   PGF  Deceased   History reviewed. No pertinent family history. Social History   Socioeconomic History   Marital status: Divorced    Spouse name: Not on file   Number of children: Not on file   Years of education: Not on file   Highest education level: Not on file  Occupational History   Not on file  Tobacco Use   Smoking status: Never   Smokeless tobacco: Never  Substance and Sexual Activity   Alcohol use: No   Drug use: No   Sexual activity: Never  Other Topics Concern   Not on file  Social History Narrative   Not on file   Social Determinants of Health   Financial Resource Strain: Not on file  Food Insecurity: Not on file  Transportation Needs: Not on file  Physical Activity: Not on file  Stress: Not on file  Social Connections: Not on file   Outpatient Medications Prior to Visit  Medication Sig  tirzepatide (MOUNJARO) 2.5 MG/0.5ML Pen 2.5 mg [place] qwk x4wk,   albuterol (VENTOLIN HFA) 108 (90 Base) MCG/ACT inhaler Inhale 1-2 puffs into the lungs every 6 (six) hours as needed for wheezing or shortness of breath.   ALPRAZolam (XANAX) 0.5 MG tablet Take 0.5 mg by mouth as needed.   ALPRAZolam (XANAX) 0.5 MG tablet 1 tablet   buPROPion (WELLBUTRIN XL) 300 MG 24 hr tablet Take 300 mg by mouth every morning.   buPROPion (WELLBUTRIN XL) 300 MG 24 hr tablet 1 tablet in the morning   chlorpheniramine-HYDROcodone (TUSSIONEX PENNKINETIC ER) 10-8 MG/5ML SUER Take 5 mLs by mouth 2 (two) times daily.   citalopram (CELEXA) 40 MG tablet Take 40 mg by mouth at  bedtime.   ipratropium-albuterol (DUONEB) 0.5-2.5 (3) MG/3ML SOLN Take 3 mLs by nebulization every 6 (six) hours as needed.   levonorgestrel (MIRENA) 20 MCG/DAY IUD by Intrauterine route.   levonorgestrel (MIRENA, 52 MG,) 20 MCG/DAY IUD See admin instructions.   meloxicam (MOBIC) 15 MG tablet Take 1 tablet (15 mg total) by mouth daily.   methylphenidate (CONCERTA) 36 MG PO CR tablet 1 tablet in the morning   methylphenidate 54 MG PO CR tablet Take 54 mg by mouth every morning.   oseltamivir (TAMIFLU) 75 MG capsule Take 1 capsule (75 mg total) by mouth 2 (two) times daily.   promethazine-dextromethorphan (PROMETHAZINE-DM) 6.25-15 MG/5ML syrup Take 5 mLs by mouth 4 (four) times daily as needed.   REXULTI 2 MG TABS tablet Take 2 mg by mouth every morning.   VRAYLAR 1.5 MG capsule Take 1.5 mg by mouth.   VYVANSE 50 MG capsule Take 50 mg by mouth every morning.   Facility-Administered Medications Prior to Visit  Medication Dose Route Frequency Provider   betamethasone acetate-betamethasone sodium phosphate (CELESTONE) injection 3 mg  3 mg Intra-articular Once Edrick Kins, DPM   Allergies  Allergen Reactions   Latex Anaphylaxis   Immunization History  Administered Date(s) Administered   DTP 01/23/1990, 03/30/1990, 06/05/1990, 03/30/1991, 09/24/1994   Hepatitis B 11/29/2002, 01/03/2003, 06/22/2003   HiB (PRP-OMP) 01/23/1990, 03/30/1990, 06/05/1990, 03/30/1991   MMR 03/30/1991, 09/24/1994   Meningococcal Polysaccharide 01/13/2007   OPV 01/23/1990, 03/30/1990, 03/30/1991, 09/24/1994   Td 01/06/2007    Health Maintenance Due: Health Maintenance  Topic Date Due   COVID-19 Vaccine (1) Never done   HIV Screening  Never done   Hepatitis C Screening  Never done   PAP SMEAR-Modifier  Never done   TETANUS/TDAP  01/05/2017   INFLUENZA VACCINE  Never done   HPV VACCINES  Aged Out    Review of Systems All review of systems negative except what is listed in the HPI   Objective    BP  117/72    Pulse 99    Resp (!) 99    Ht '5\' 4"'  (1.626 m)    Wt 231 lb 9.6 oz (105.1 kg)    BMI 39.75 kg/m  Physical Exam Vitals and nursing note reviewed.  Constitutional:      General: She is not in acute distress.    Appearance: Normal appearance.  Eyes:     Extraocular Movements: Extraocular movements intact.     Conjunctiva/sclera: Conjunctivae normal.     Pupils: Pupils are equal, round, and reactive to light.  Neck:     Vascular: No carotid bruit.  Cardiovascular:     Rate and Rhythm: Normal rate and regular rhythm.     Pulses: Normal pulses.     Heart sounds: Normal  heart sounds. No murmur heard. Pulmonary:     Effort: Pulmonary effort is normal.     Breath sounds: Normal breath sounds. No wheezing.  Abdominal:     General: Bowel sounds are normal.     Palpations: Abdomen is soft.  Musculoskeletal:        General: Normal range of motion.     Cervical back: Normal range of motion.     Right lower leg: No edema.     Left lower leg: No edema.     Comments: Crepitus present to the left knee with extension and crepitus present to the left knee.  No signs of erythema or edema are present at this time.  Range of motion appears to be intact.  Gait intact.  Skin:    General: Skin is warm and dry.     Capillary Refill: Capillary refill takes less than 2 seconds.  Neurological:     General: No focal deficit present.     Mental Status: She is alert and oriented to person, place, and time.  Psychiatric:        Mood and Affect: Mood normal.        Behavior: Behavior normal.        Thought Content: Thought content normal.        Judgment: Judgment normal.    No results found for any visits on 04/03/21.  Assessment & Plan      Problem List Items Addressed This Visit     Morbid obesity ([place])    BMI 39.75 today. Recommendations for diet and activity provided on AVS. We will plan to monitor labs and evaluate for possible conditions that could be contributing in near  future.        Relevant Medications   tirzepatide (MOUNJARO) 2.5 MG/0.5ML Pen   methylphenidate (CONCERTA) 36 MG PO CR tablet   Recurrent major depression in full remission ([place])    Longstanding history of bipolar disorder predominantly depressive symptoms well controlled at this time.  Patient is currently seeking care from psychiatry and is happy with services and treatments provided. Recommend continue treatment with psychiatry and follow-up if symptoms worsen or new symptoms develop.      Relevant Medications   ALPRAZolam (XANAX) 0.5 MG tablet   buPROPion (WELLBUTRIN XL) 300 MG 24 hr tablet   Hyperhidrosis - Primary    Symptoms and presentation consistent with hyperhidrosis. Patient has had complete work-up on this and tried and failed topical treatments. Recommend referral to dermatology for discussion and evaluation of treatment options that may be beneficial to her. Will send referral today.        Relevant Orders   Ambulatory referral to Dermatology   Arthritis of left knee    History of previous knee surgery x2 with resulting arthritic condition and current pain.  No signs of infection or new injury present today. Recommend follow-up with Dr. Burnard Bunting for further evaluation and recommendations regarding joint injection therapy.      Acne    Chronic acne not controlled with topical treatments. Recommendation for trial of spironolactone to see if this is helpful for patient. Recommend 25 mg once daily and will monitor for symptom improvement. Will also send referral to dermatology for further evaluation and recommendations as the patient is also experiencing hyperhidrosis. Discussions and warnings on monitoring for hypovolemia and low blood pressure while on medication. We will plan to follow-up in near future.      Relevant Orders   Ambulatory referral to Dermatology  Return for Dr Burnard Bunting for knee injection.     Time: 58 minutes, >50% spent counseling, care  coordination, chart review, and documentation.    Kaisey Huseby, Anna Pesa, NP, DNP, AGNP-C Primary Care & Sports Medicine at [facility]

## 2021-04-04 NOTE — Addendum Note (Signed)
Addended by: Carnella Guadalajara on: 04/04/2021 01:59 PM   Modules accepted: Orders

## 2021-04-10 ENCOUNTER — Other Ambulatory Visit: Payer: Self-pay

## 2021-04-10 ENCOUNTER — Encounter (HOSPITAL_BASED_OUTPATIENT_CLINIC_OR_DEPARTMENT_OTHER): Payer: Self-pay | Admitting: Family Medicine

## 2021-04-10 ENCOUNTER — Ambulatory Visit (INDEPENDENT_AMBULATORY_CARE_PROVIDER_SITE_OTHER): Payer: BLUE CROSS/BLUE SHIELD | Admitting: Family Medicine

## 2021-04-10 DIAGNOSIS — G8929 Other chronic pain: Secondary | ICD-10-CM

## 2021-04-10 DIAGNOSIS — M25562 Pain in left knee: Secondary | ICD-10-CM

## 2021-04-10 NOTE — Patient Instructions (Signed)
°  Medication Instructions:  Your physician recommends that you continue on your current medications as directed. Please refer to the Current Medication list given to you today. --If you need a refill on any your medications before your next appointment, please call your pharmacy first. If no refills are authorized on file call the office.--  Referrals/Procedures/Imaging: Your physician recommends that you have an XRAY of your LEFT KNEE. X-rays are a type of radiation called electromagnetic waves. X-ray imaging creates pictures of the inside of your body. The images show the parts of your body in different shades of black and white. This is because different tissues absorb different amounts of radiation. Calcium in bones absorbs x-rays the most, so bones look white. Fat and other soft tissues absorb less and look gray. Air absorbs the least, so lungs look black     1. You may have this done at the [facility], located in the [facility] Building on the 1st floor.    2. You do no have to have an appointment.    3. [address] [place], [place] [address]        [phone]        Monday - Friday  8:00 am - 5:00 pm 4. [facility]  [facility] is a premier diagnostic imaging center that offers a 64-slice CT scanner and interventional radiology services.  Follow-Up: Your next appointment:   Your physician recommends that you schedule a follow-up appointment in: TBD with Dr. de Peru      Thanks for letting us be apart of your health journey!!        [facility]   Dr. Raymond de [place] II., MD, MPH               We recommend signing up for the patient portal called "MyChart".  Sign up information is provided on this After Visit Summary.  MyChart is used to connect with patients for Virtual Visits (Telemedicine).  Patients are able to view lab/test results,  encounter notes, upcoming appointments, etc.  Non-urgent messages can be sent to your provider as well.   To learn more about what you can do with MyChart, please visit --  [email or web address].

## 2021-04-10 NOTE — Progress Notes (Signed)
° ° °  Procedures performed today:    None.  Independent interpretation of notes and tests performed by another provider:   None.  Brief History, Exam, Impression, and Recommendations:    Ht 5\' 4"  (1.626 m)    Wt 230 lb (104.3 kg)    BMI 39.48 kg/m   Left knee pain Anna Vargas is a 31 year old female presenting for evaluation of left knee pain.  She reports that she has had left knee pain for more than 10 years.  Denies any specific injury which led to initial onset of symptoms.  Used to do more distance running around time of initial onset.  Reports 2 prior procedures, in 0000000 had plica removed, in [id number] reports having scar tissue removed and told that she had arthritis.  Prior surgeries or locally as well as in [place].  She felt that her knee was never back to baseline after the surgeries.  Recalls last x-ray was in 2014. More recently, she has been working with physical therapy, this was back in 2021.  Since then she has continued with doing home exercise program.  Feels that she has a chronic dull pain.  Pain is primarily localized over medial aspect of knee, medial aspect of patella. During typical daily activities, feels that pain is a dull pain.  Otherwise, feels that pain limits her ability to hike which she enjoys doing.  Also notes worsening of symptoms with stairs, feels worse going upstairs and downstairs. She has tried some bracing, recalls using 3 different braces in the past without significant relief, does describe J brace, unsure of other braces, does not seem that she has tried reaction knee brace On exam, Left knee: No obvious deformity. No effusion.  Negative patellar grind.  Negative crepitus.  She does have tenderness to palpation along medial aspect of patella, medial joint line. Full ROM for flexion and extension.  Strength 5 out of 5 for flexion and extension. Anterior drawer: Negative Posterior drawer: Negative Lachman: Negative Varus stress test: Negative Valgus stress  test: Negative McMurray's: Negative Thessaly: Pain, no clicking Neurovascularly intact.  No evidence of lymphatic disease.  History and exam are suggestive of patellofemoral pain syndrome.  By patient report, she was told that she has some arthritis in the knee as well.  There is also been some discussion in the past regarding steroid injection and she has questions about this. Would initially proceed with x-ray imaging for initial assessment In regards to her patellofemoral pain syndrome, feel that bracing, PT, home exercises are likely to be most beneficial.  She would consider alternative brace, would prefer to hold off on physical therapy at this time due to financial concerns Depending on findings from x-ray, steroid injection may be an option for her We will plan for follow-up after x-rays are obtained to determine whether to proceed with corticosteroid injection of left knee.   ___________________________________________ Bulmaro Feagans de [place], MD, ABFM, CAQSM [facility] [place]

## 2021-04-11 NOTE — Assessment & Plan Note (Signed)
Anna Vargas is a 31 year old female presenting for evaluation of left knee pain.  She reports that she has had left knee pain for more than 10 years.  Denies any specific injury which led to initial onset of symptoms.  Used to do more distance running around time of initial onset.  Reports 2 prior procedures, in 2012 had plica removed, in 2014 reports having scar tissue removed and told that she had arthritis.  Prior surgeries or locally as well as in [place].  She felt that her knee was never back to baseline after the surgeries.  Recalls last x-ray was in 2014. More recently, she has been working with physical therapy, this was back in 2021.  Since then she has continued with doing home exercise program.  Feels that she has a chronic dull pain.  Pain is primarily localized over medial aspect of knee, medial aspect of patella. During typical daily activities, feels that pain is a dull pain.  Otherwise, feels that pain limits her ability to hike which she enjoys doing.  Also notes worsening of symptoms with stairs, feels worse going upstairs and downstairs. She has tried some bracing, recalls using 3 different braces in the past without significant relief, does describe J brace, unsure of other braces, does not seem that she has tried reaction knee brace On exam, Left knee: No obvious deformity. No effusion.  Negative patellar grind.  Negative crepitus.  She does have tenderness to palpation along medial aspect of patella, medial joint line. Full ROM for flexion and extension.  Strength 5 out of 5 for flexion and extension. Anterior drawer: Negative Posterior drawer: Negative Lachman: Negative Varus stress test: Negative Valgus stress test: Negative McMurray's: Negative Thessaly: Pain, no clicking Neurovascularly intact.  No evidence of lymphatic disease.  History and exam are suggestive of patellofemoral pain syndrome.  By patient report, she was told that she has some arthritis in the knee as well.   There is also been some discussion in the past regarding steroid injection and she has questions about this. Would initially proceed with x-ray imaging for initial assessment In regards to her patellofemoral pain syndrome, feel that bracing, PT, home exercises are likely to be most beneficial.  She would consider alternative brace, would prefer to hold off on physical therapy at this time due to financial concerns Depending on findings from x-ray, steroid injection may be an option for her We will plan for follow-up after x-rays are obtained to determine whether to proceed with corticosteroid injection of left knee.

## 2021-04-24 DIAGNOSIS — F411 Generalized anxiety disorder: Secondary | ICD-10-CM | POA: Diagnosis not present

## 2021-04-24 DIAGNOSIS — Z79891 Long term (current) use of opiate analgesic: Secondary | ICD-10-CM | POA: Diagnosis not present

## 2021-04-24 DIAGNOSIS — F3341 Major depressive disorder, recurrent, in partial remission: Secondary | ICD-10-CM | POA: Diagnosis not present

## 2021-05-14 ENCOUNTER — Other Ambulatory Visit: Payer: Self-pay

## 2021-05-14 ENCOUNTER — Ambulatory Visit
Admission: RE | Admit: 2021-05-14 | Discharge: 2021-05-14 | Disposition: A | Discharge status: Home / Self Care | Payer: BLUE CROSS/BLUE SHIELD | Source: Ambulatory Visit | Attending: Emergency Medicine | Admitting: Emergency Medicine

## 2021-05-14 VITALS — BP 118/71 | HR 103 | Temp 98.7°F | Resp 20

## 2021-05-14 DIAGNOSIS — J310 Chronic rhinitis: Secondary | ICD-10-CM | POA: Diagnosis not present

## 2021-05-14 DIAGNOSIS — J329 Chronic sinusitis, unspecified: Secondary | ICD-10-CM | POA: Diagnosis not present

## 2021-05-14 DIAGNOSIS — J029 Acute pharyngitis, unspecified: Secondary | ICD-10-CM

## 2021-05-14 DIAGNOSIS — R52 Pain, unspecified: Secondary | ICD-10-CM | POA: Diagnosis not present

## 2021-05-14 NOTE — ED Provider Notes (Signed)
[facility]    CSN: [id number] Arrival date & time: 05/14/21  1318    HISTORY   Chief Complaint  Patient presents with   Nasal Congestion   Sore Throat   Headache   HPI Anna Vargas Start is a 31 y.o. female. Patient complains of fever, headache, sore throat and congestion that started last night, states she did not check her temperature.  Patient states she is here today because her employer requires a COVID test.  Patient endorses sick contacts.  Patient states she has tried Sudafed for symptoms but it does not really seem to be helping.  Patient denies difficulty breathing or swallowing.  Patient denies loss of smell, nausea, vomiting, diarrhea.   History reviewed. No pertinent past medical history. 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    Home Medications    Prior to Admission medications   Medication Sig Start Date End Date Taking? Authorizing Provider  ALPRAZolam Prudy Feeler) 0.5 MG tablet Take 0.5 mg by mouth as needed. 10/10/20   [provider]  buPROPion (WELLBUTRIN XL) 300 MG 24 hr tablet Take 300 mg by mouth every morning. 08/11/20   [provider]  citalopram (CELEXA) 40 MG tablet Take 40 mg by mouth at bedtime. 08/11/20   [provider]  levonorgestrel (MIRENA) 20 MCG/DAY IUD by Intrauterine route.    [provider]  meloxicam (MOBIC) 15 MG tablet Take 1 tablet (15 mg total) by mouth daily. 03/19/21   Felecia Shelling, DPM  spironolactone (ALDACTONE) 25 MG tablet Take 1 tablet (25 mg total) by  mouth daily. 04/03/21   Tollie Eth, NP  VRAYLAR 1.5 MG capsule Take 1.5 mg by mouth. 03/16/21   [provider]  VYVANSE 50 MG capsule Take 50 mg by mouth every morning. 12/13/20   [provider]   Family History History reviewed. No pertinent family history. Social History Social History   Tobacco Use   Smoking status: Never   Smokeless tobacco: Never  Substance Use Topics   Alcohol use: No   Drug use: No   Allergies   Latex  Review of Systems Review of Systems Pertinent findings noted in history of present illness.   Physical Exam Triage Vital Signs ED Triage Vitals  Enc Vitals Group     BP 12/29/20 0827 (!) 147/82     Pulse Rate 12/29/20 0827 72     Resp 12/29/20 0827 18     Temp 12/29/20 0827 98.3 F (36.8 C)     Temp Source 12/29/20 0827 Oral     SpO2 12/29/20 0827 98 %     Weight --      Height --      Head Circumference --      Peak Flow --      Pain Score 12/29/20 0826 5     Pain Loc --      Pain Edu? --      Excl. in GC? --   No data found.  Updated Vital Signs BP  118/71 (BP Location: Left Arm)    Pulse (!) 103    Temp 98.7 F (37.1 C) (Oral)    Resp 20    SpO2 97%   Physical Exam Vitals and nursing note reviewed.  Constitutional:      General: She is not in acute distress.    Appearance: Normal appearance. She is not ill-appearing.  HENT:     Head: Normocephalic and atraumatic.     Salivary Glands: Right salivary gland is not diffusely enlarged or tender. Left salivary gland is not diffusely enlarged or tender.     Right Ear: Tympanic membrane, ear canal and external ear normal. No drainage. No middle ear effusion. There is no impacted cerumen. Tympanic membrane is not erythematous or bulging.     Left Ear: Tympanic membrane, ear canal and external ear normal. No drainage.  No middle ear effusion. There is no impacted cerumen. Tympanic membrane is not erythematous or bulging.     Nose: Nose normal. No nasal deformity, septal  deviation, mucosal edema, congestion or rhinorrhea.     Right Turbinates: Not enlarged, swollen or pale.     Left Turbinates: Not enlarged, swollen or pale.     Right Sinus: No maxillary sinus tenderness or frontal sinus tenderness.     Left Sinus: No maxillary sinus tenderness or frontal sinus tenderness.     Mouth/Throat:     Lips: Pink. No lesions.     Mouth: Mucous membranes are moist. No oral lesions.     Pharynx: Oropharynx is clear. Uvula midline. No posterior oropharyngeal erythema or uvula swelling.     Tonsils: No tonsillar exudate. 0 on the right. 0 on the left.  Eyes:     General: Lids are normal.        Right eye: No discharge.        Left eye: No discharge.     Extraocular Movements: Extraocular movements intact.     Conjunctiva/sclera: Conjunctivae normal.     Right eye: Right conjunctiva is not injected.     Left eye: Left conjunctiva is not injected.  Neck:     Trachea: Trachea and phonation normal.  Cardiovascular:     Rate and Rhythm: Normal rate and regular rhythm.     Pulses: Normal pulses.     Heart sounds: Normal heart sounds. No murmur heard.   No friction rub. No gallop.  Pulmonary:     Effort: Pulmonary effort is normal. No accessory muscle usage, prolonged expiration or respiratory distress.     Breath sounds: Normal breath sounds. No stridor, decreased air movement or transmitted upper airway sounds. No decreased breath sounds, wheezing, rhonchi or rales.  Chest:     Chest wall: No tenderness.  Musculoskeletal:        General: Normal range of motion.     Cervical back: Normal range of motion and neck supple. Normal range of motion.  Lymphadenopathy:     Cervical: No cervical adenopathy.  Skin:    General: Skin is warm and dry.     Findings: No erythema or rash.  Neurological:     General: No focal deficit present.     Mental Status: She is alert and oriented to person, place, and time.  Psychiatric:        Mood and Affect: Mood normal.         Behavior: Behavior normal.    Visual Acuity Right Eye Distance:   Left Eye Distance:   Bilateral Distance:    Right  Eye Near:   Left Eye Near:    Bilateral Near:     UC Couse / Diagnostics / Procedures:    EKG  Radiology No results found.  Procedures Procedures (including critical care time)  UC Diagnoses / Final Clinical Impressions(s)   I have reviewed the triage vital signs and the nursing notes.  Pertinent labs & imaging results that were available during my care of the patient were reviewed by me and considered in my medical decision making (see chart for details).   Final diagnoses:  Pharyngitis, unspecified etiology  Rhinosinusitis  Generalized body aches   Patient is well-appearing on exam today with normal vital signs with the exception of a mildly elevated heart rate.  COVID/flu testing results pending, will notify patient with results once received.  Patient provided with a note for work.  Conservative care recommended.  ED Prescriptions   None    PDMP not reviewed this encounter.  Pending results:  Labs Reviewed  COVID-19, FLU A+Vargas NAA    Medications Ordered in UC: Medications - No data to display  Disposition Upon Discharge:  Condition: stable for discharge home Home: take medications as prescribed; routine discharge instructions as discussed; follow up as advised.  Patient presented with an acute illness with associated systemic symptoms and significant discomfort requiring urgent management. In my opinion, this is a condition that a prudent lay person (someone who possesses an average knowledge of health and medicine) may potentially expect to result in complications if not addressed urgently such as respiratory distress, impairment of bodily function or dysfunction of bodily organs.   Routine symptom specific, illness specific and/or disease specific instructions were discussed with the patient and/or caregiver at length.   As such, the patient  has been evaluated and assessed, work-up was performed and treatment was provided in alignment with urgent care protocols and evidence based medicine.  Patient/parent/caregiver has been advised that the patient may require follow up for further testing and treatment if the symptoms continue in spite of treatment, as clinically indicated and appropriate.  If the patient was tested for COVID-19, Influenza and/or RSV, then the patient/parent/guardian was advised to isolate at home pending the results of his/her diagnostic coronavirus test and potentially longer if theyre positive. I have also advised pt that if his/her COVID-19 test returns positive, it's recommended to self-isolate for at least 10 days after symptoms first appeared AND until fever-free for 24 hours without fever reducer AND other symptoms have improved or resolved. Discussed self-isolation recommendations as well as instructions for household member/close contacts as per the [facility] and [place] DHHS, and also gave patient the COVID packet with this information.  Patient/parent/caregiver has been [facility] 3-5 days if no better; to PCP or the Emergency Department if new signs and symptoms develop, or if the current signs or symptoms continue to change or worsen for further workup, evaluation and treatment as clinically indicated and appropriate  The patient will follow up with their current PCP if and as advised. If the patient does not currently have a PCP we will assist them in obtaining one.   The patient may need specialty follow up if the symptoms continue, in spite of conservative treatment and management, for further workup, evaluation, consultation and treatment as clinically indicated and appropriate.  Patient/parent/caregiver verbalized understanding and agreement of plan as discussed.  All questions were addressed during visit.  Please see discharge instructions below for further details of plan.  Discharge  Instructions:   Discharge Instructions      Your symptoms and physical exam findings are concerning for a viral respiratory infection.  You were tested for both COVID and influenza today, the result of your viral testing will be posted to your MyChart once it is complete, this typically takes 24 to 48 hours.  If there is a positive result, you will be contacted by phone with further recommendations, if any.    Ibuprofen  (Advil, Motrin): This is a good anti-inflammatory medication which addresses aches, pains and inflammation of the upper airways that causes sinus and nasal congestion as well as in the lower airways which makes your cough feel tight and sometimes burn.  I recommend that you take between 400 to 600 mg every 6-8 hours as needed.      Acetaminophen (Tylenol): This is a good fever reducer.  If your body temperature rises above 101.5 as measured with a thermometer, it is recommended that you take 1,000 mg every 8 hours until your temperature falls below 101.5, please not take more than 3,000 mg of acetaminophen either as a separate medication or as in ingredient in an over-the-counter cold/flu preparation within a 24-hour period.      Fluticasone (Flonase): This is a steroid nasal spray that you use once daily, 1 spray in each nare.  After 3 to 5 days of use, you will have significant improvement of the inflammation and mucus production that is being caused by exposure to allergens.  This medication can be purchased over-the-counter.      Cetirizine (Zyrtec): This is an excellent second-generation antihistamine that helps to reduce respiratory inflammatory response to viruses and environmental allergens.  Please take 1 tablet daily at bedtime.   Conservative care is also recommended at this time.  This includes rest, pushing clear fluids and activity as tolerated.  Warm beverages such as teas and broths versus cold beverages/popsicles and frozen sherbet/sorbet are your choice, both warm  and cold are beneficial.  You may also notice that your appetite is reduced; this is okay as long as you are drinking plenty of clear fluids.    Please follow-up within the next 3 to 5 days either with your primary care provider or urgent care if your symptoms do not resolve.  If you do not have a primary care provider, we will assist you in finding one.   Thank you for visiting urgent care today.  We appreciate the opportunity to participate in your care.     This office note has been dictated using Teaching laboratory technician.  Unfortunately, and despite my best efforts, this method of dictation can sometimes lead to occasional typographical or grammatical errors.  I apologize in advance if this occurs.     Theadora Rama Scales, PA-C 05/14/21 1354

## 2021-05-14 NOTE — ED Triage Notes (Signed)
Pt c/o fever, sore throat, congestion, headache and is needing covid test for work. ?Started: last night   ?

## 2021-05-14 NOTE — Discharge Instructions (Signed)
Your symptoms and physical exam findings are concerning for a viral respiratory infection.  You were tested for both COVID and influenza today, the result of your viral testing will be posted to your MyChart once it is complete, this typically takes 24 to 48 hours.  If there is a positive result, you will be contacted by phone with further recommendations, if any.  ?  ?Ibuprofen  (Advil, Motrin): This is a good anti-inflammatory medication which addresses aches, pains and inflammation of the upper airways that causes sinus and nasal congestion as well as in the lower airways which makes your cough feel tight and sometimes burn.  I recommend that you take between 400 to 600 mg every 6-8 hours as needed.    ?  ?Acetaminophen (Tylenol): This is a good fever reducer.  If your body temperature rises above 101.5 as measured with a thermometer, it is recommended that you take 1,000 mg every 8 hours until your temperature falls below 101.5, please not take more than 3,000 mg of acetaminophen either as a separate medication or as in ingredient in an over-the-counter cold/flu preparation within a 24-hour period.    ?  ?Fluticasone (Flonase): This is a steroid nasal spray that you use once daily, 1 spray in each nare.  After 3 to 5 days of use, you will have significant improvement of the inflammation and mucus production that is being caused by exposure to allergens.  This medication can be purchased over-the-counter.    ?  ?Cetirizine (Zyrtec): This is an excellent second-generation antihistamine that helps to reduce respiratory inflammatory response to viruses and environmental allergens.  Please take 1 tablet daily at bedtime. ?  ?Conservative care is also recommended at this time.  This includes rest, pushing clear fluids and activity as tolerated.  Warm beverages such as teas and broths versus cold beverages/popsicles and frozen sherbet/sorbet are your choice, both warm and cold are beneficial.  You may also notice that  your appetite is reduced; this is okay as long as you are drinking plenty of clear fluids.  ?  ?Please follow-up within the next 3 to 5 days either with your primary care provider or urgent care if your symptoms do not resolve.  If you do not have a primary care provider, we will assist you in finding one. ?  ?Thank you for visiting urgent care today.  We appreciate the opportunity to participate in your care. ? ?

## 2021-05-15 DIAGNOSIS — L74511 Primary focal hyperhidrosis, face: Secondary | ICD-10-CM | POA: Diagnosis not present

## 2021-05-15 DIAGNOSIS — L700 Acne vulgaris: Secondary | ICD-10-CM | POA: Diagnosis not present

## 2021-05-15 DIAGNOSIS — L74519 Primary focal hyperhidrosis, unspecified: Secondary | ICD-10-CM | POA: Diagnosis not present

## 2021-05-15 DIAGNOSIS — L74510 Primary focal hyperhidrosis, axilla: Secondary | ICD-10-CM | POA: Diagnosis not present

## 2021-05-16 LAB — COVID-19, FLU A+B NAA
Influenza A, NAA: NOT DETECTED
Influenza B, NAA: NOT DETECTED
SARS-CoV-2, NAA: NOT DETECTED

## 2021-06-11 ENCOUNTER — Ambulatory Visit (HOSPITAL_BASED_OUTPATIENT_CLINIC_OR_DEPARTMENT_OTHER): Payer: BLUE CROSS/BLUE SHIELD | Admitting: Nurse Practitioner

## 2021-07-06 ENCOUNTER — Other Ambulatory Visit: Payer: Self-pay

## 2021-07-06 ENCOUNTER — Encounter (HOSPITAL_BASED_OUTPATIENT_CLINIC_OR_DEPARTMENT_OTHER): Payer: Self-pay

## 2021-07-06 DIAGNOSIS — R590 Localized enlarged lymph nodes: Secondary | ICD-10-CM | POA: Diagnosis not present

## 2021-07-06 DIAGNOSIS — H66002 Acute suppurative otitis media without spontaneous rupture of ear drum, left ear: Secondary | ICD-10-CM | POA: Diagnosis not present

## 2021-07-06 DIAGNOSIS — Z91040 Latex allergy status: Secondary | ICD-10-CM | POA: Diagnosis not present

## 2021-07-06 DIAGNOSIS — H9202 Otalgia, left ear: Secondary | ICD-10-CM | POA: Diagnosis present

## 2021-07-06 NOTE — ED Triage Notes (Signed)
Pt states that this afternoon she began to have pain in her L ear, went to UC and was told she had a bulging ear drum, pt also having neck pain, given prednisone and not helping.   ?

## 2021-07-07 ENCOUNTER — Emergency Department (HOSPITAL_BASED_OUTPATIENT_CLINIC_OR_DEPARTMENT_OTHER)
Admission: EM | Admit: 2021-07-07 | Discharge: 2021-07-07 | Disposition: A | Discharge status: Home / Self Care | Payer: 59 | Attending: Emergency Medicine | Admitting: Emergency Medicine

## 2021-07-07 DIAGNOSIS — H66002 Acute suppurative otitis media without spontaneous rupture of ear drum, left ear: Secondary | ICD-10-CM | POA: Diagnosis not present

## 2021-07-07 MED ORDER — AMOXICILLIN-POT CLAVULANATE 875-125 MG PO TABS: 1.0000 | ORAL_TABLET | Freq: Two times a day (BID) | ORAL | 0 refills | Status: DC

## 2021-07-07 MED ORDER — OXYCODONE-ACETAMINOPHEN 5-325 MG PO TABS
1.0000 | ORAL_TABLET | Freq: Once | ORAL | Status: AC
  Administered 2021-07-07: 1 via ORAL
  Filled 2021-07-07: qty 1

## 2021-07-07 MED ORDER — IBUPROFEN 800 MG PO TABS: 800.0000 mg | ORAL_TABLET | Freq: Three times a day (TID) | ORAL | 0 refills | Status: DC | PRN

## 2021-07-07 MED ORDER — KETOROLAC TROMETHAMINE 30 MG/ML IJ SOLN
30.0000 mg | Freq: Once | INTRAMUSCULAR | Status: AC
Start: 2021-07-07 — End: 2021-07-07
  Administered 2021-07-07: 30 mg via INTRAMUSCULAR
  Filled 2021-07-07: qty 1

## 2021-07-07 MED ORDER — AMOXICILLIN-POT CLAVULANATE 875-125 MG PO TABS
1.0000 | ORAL_TABLET | Freq: Once | ORAL | Status: AC
  Administered 2021-07-07: 1 via ORAL
  Filled 2021-07-07: qty 1

## 2021-07-07 NOTE — Discharge Instructions (Signed)
You were seen today for ear pain.  Continue Flonase and nasal saline.  Take scheduled ibuprofen for the next 2 to 3 days.  Take antibiotics as prescribed.  Follow-up with ENT if not improving. ?

## 2021-07-07 NOTE — ED Provider Notes (Signed)
?[facility]PT ?Provider Note ? ? ?CSN: [id number] ?Arrival date & time: 07/06/21  2330 ? ?  ? ?History ? ?Chief Complaint  ?Patient presents with  ? Otalgia  ? ? ?Anna RifeCarlyn B Vargas is a 31 y.o. female. ? ?HPI ? ?  ? ?This is a 31 year old female who presents with left ear pain.  Patient reports that she was sitting at work today when she had acute onset of left ear pain.  She does report several day history of sore throat.  No fevers or cough.  Patient states that the pain has been unbearable.  She did go to an urgent care and was prescribed prednisone.  She had also taken a decongestant and nasal saline with minimal relief.  She took 400 mg of ibuprofen but had ongoing pain.  She has not noted any drainage.  She states pain goes into her neck. ? ?Home Medications ?Prior to Admission medications   ?Medication Sig Start Date End Date Taking? Authorizing Provider  ?amoxicillin-clavulanate (AUGMENTIN) 875-125 MG tablet Take 1 tablet by mouth every 12 (twelve) hours. 07/07/21  Yes Dalvin Clipper, Mayer Maskerourtney F, MD  ?ibuprofen (ADVIL) 800 MG tablet Take 1 tablet (800 mg total) by mouth every 8 (eight) hours as needed for moderate pain. 07/07/21  Yes Keya[place] Graefe, Mayer Maskerourtney F, MD  ?ALPRAZolam Prudy Feeler(XANAX) 0.5 MG tablet Take 0.5 mg by mouth as needed. 10/10/20   [provider]  ?buPROPion (WELLBUTRIN XL) 300 MG 24 hr tablet Take 300 mg by mouth every morning. 08/11/20   [provider]  ?citalopram (CELEXA) 40 MG tablet Take 40 mg by mouth at bedtime. 08/11/20   [provider]  ?levonorgestrel (MIRENA) 20 MCG/DAY IUD by Intrauterine route.    [provider]  ?meloxicam (MOBIC) 15 MG tablet Take 1 tablet (15 mg total) by mouth daily. 03/19/21   Felecia ShellingEvans, Brent M, DPM  ?spironolactone (ALDACTONE) 25 MG tablet Take 1 tablet (25 mg total) by mouth daily. 04/03/21   Tollie EthEarly, Sara E, NP  ?VRAYLAR 1.5 MG capsule Take 1.5 mg by mouth. 03/16/21   [provider]  ?VYVANSE 50 MG capsule Take 50 mg by  mouth every morning. 12/13/20   [provider]  ?   ? ?Allergies    ?Latex   ? ?Review of Systems   ?Review of Systems  ?Constitutional:  Negative for fever.  ?HENT:  Positive for ear pain.   ?All other systems reviewed and are negative. ? ?Physical Exam ?Updated Vital Signs ?BP (!) 169/113   Pulse 99   Temp 98 ?F (36.7 ?C) (Oral)   Resp 17   Ht 1.626 m (5\' 4" )   Wt 106.6 kg   SpO2 98%   BMI 40.34 kg/m?  ?Physical Exam ?Vitals and nursing note reviewed.  ?Constitutional:   ?   Appearance: She is well-developed.  ?HENT:  ?   Head: Normocephalic and atraumatic.  ?   Right Ear: Tympanic membrane normal.  ?   Ears:  ?   Comments: Left TM bulging and erythematous, purulent effusion noted, TM intact ?   Mouth/Throat:  ?   Mouth: Mucous membranes are moist.  ?Eyes:  ?   Pupils: Pupils are equal, round, and reactive to light.  ?Cardiovascular:  ?   Rate and Rhythm: Normal rate and regular rhythm.  ?Pulmonary:  ?   Effort: Pulmonary effort is normal. No respiratory distress.  ?Abdominal:  ?   Palpations: Abdomen is soft.  ?Musculoskeletal:  ?   Cervical back: Neck  supple.  ?Lymphadenopathy:  ?   Cervical: Cervical adenopathy present.  ?Skin: ?   General: Skin is warm and dry.  ?Neurological:  ?   Mental Status: She is alert and oriented to person, place, and time.  ?Psychiatric:     ?   Mood and Affect: Mood normal.  ? ? ?ED Results / Procedures / Treatments   ?Labs ?(all labs ordered are listed, but only abnormal results are displayed) ?Labs Reviewed - No data to display ? ?EKG ?None ? ?Radiology ?No results found. ? ?Procedures ?Procedures  ? ? ?Medications Ordered in ED ?Medications  ?ketorolac (TORADOL) 30 MG/ML injection 30 mg (has no administration in time range)  ?oxyCODONE-acetaminophen (PERCOCET/ROXICET) 5-325 MG per tablet 1 tablet (has no administration in time range)  ?amoxicillin-clavulanate (AUGMENTIN) 875-125 MG per tablet 1 tablet (has no administration in time range)  ? ? ?ED Course/ Medical  Decision Making/ A&P ?  ?                        ?Medical Decision Making ?Risk ?Prescription drug management. ? ? ?This patient presents to the ED for concern of otalgia, this involves an extensive number of treatment options, and is a complaint that carries with it a high risk of complications and morbidity.  The differential diagnosis includes eustachian tube dysfunction, acute otitis media, tympanic membrane rupture ? ?MDM:   ? ?This is a 31 year old female who presents with otalgia.  She is nontoxic and vital signs are reassuring.  She is afebrile.  Clinical exam is suspicious for acute otitis media.  TM is intact.  She has significant erythema and bulging.  I reviewed her urgent care note.  Clinically her exam today appears to be more than eustachian tube dysfunction.  Discussed with the patient that most ear infections do start out viral especially given her recent sore throat.  Patient was given 1 dose of Percocet and Toradol.  We discussed increasing anti-inflammatories and taking them scheduled over the next 2 to 3 days.  Given significance of her exam and her pain, she was offered antibiotics.  This would also cover for any potential strep throat although I feel this is unlikely. ?Admission considered for N/A ?(Labs, imaging, consults) ? ?Labs: ?I Ordered, and personally interpreted labs.  The pertinent results include: None ? ?Imaging Studies ordered: ?I ordered imaging studies including none ?I independently visualized and interpreted imaging. ?I agree with the radiologist interpretation ? ?Additional history obtained from family at bedside.  External records from outside source obtained and reviewed including urgent care notes ? ?Cardiac Monitoring: ?The patient was maintained on a cardiac monitor.  I personally viewed and interpreted the cardiac monitored which showed an underlying rhythm of: Sinus rhythm ? ?Reevaluation: ?After the interventions noted above, I reevaluated the patient and found that  they have :stayed the same ? ?Social Determinants of Health: ?Lives independently ? ?Disposition: Discharge ? ?Co morbidities that complicate the patient evaluation ?History reviewed. No pertinent past medical history.  ? ?Medicines ?Meds ordered this encounter  ?Medications  ? ketorolac (TORADOL) 30 MG/ML injection 30 mg  ? oxyCODONE-acetaminophen (PERCOCET/ROXICET) 5-325 MG per tablet 1 tablet  ? amoxicillin-clavulanate (AUGMENTIN) 875-125 MG tablet  ?  Sig: Take 1 tablet by mouth every 12 (twelve) hours.  ?  Dispense:  20 tablet  ?  Refill:  0  ? ibuprofen (ADVIL) 800 MG tablet  ?  Sig: Take 1 tablet (800 mg total) by mouth every 8 (eight)  hours as needed for moderate pain.  ?  Dispense:  20 tablet  ?  Refill:  0  ? amoxicillin-clavulanate (AUGMENTIN) 875-125 MG per tablet 1 tablet  ?  ?I have reviewed the patients home medicines and have made adjustments as needed ? ?Problem List / ED Course: ?Problem List Items Addressed This Visit   ?None ?Visit Diagnoses   ? ? Non-recurrent acute suppurative otitis media of left ear without spontaneous rupture of tympanic membrane    -  Primary  ? Relevant Medications  ? amoxicillin-clavulanate (AUGMENTIN) 875-125 MG tablet  ? amoxicillin-clavulanate (AUGMENTIN) 875-125 MG per tablet 1 tablet (Start on 07/07/2021  2:30 AM)  ? ?  ?  ? ? ? ? ? ? ? ? ? ? ? ? ?Final Clinical Impression(s) / ED Diagnoses ?Final diagnoses:  ?Non-recurrent acute suppurative otitis media of left ear without spontaneous rupture of tympanic membrane  ? ? ?Rx / DC Orders ?ED Discharge Orders   ? ?      Ordered  ?  amoxicillin-clavulanate (AUGMENTIN) 875-125 MG tablet  Every 12 hours       ? 07/07/21 0216  ?  ibuprofen (ADVIL) 800 MG tablet  Every 8 hours PRN       ? 07/07/21 0216  ? ?  ?  ? ?  ? ? ?  ?Shon Baton, MD ?07/07/21 [phone] ? ?

## 2021-07-07 NOTE — ED Notes (Signed)
Pt verbalizes understanding of discharge instructions. Opportunity for questioning and answers were provided. Pt discharged from ED to home with mother.   ? ?

## 2021-07-16 DIAGNOSIS — H6993 Unspecified Eustachian tube disorder, bilateral: Secondary | ICD-10-CM | POA: Insufficient documentation

## 2021-09-21 ENCOUNTER — Ambulatory Visit (INDEPENDENT_AMBULATORY_CARE_PROVIDER_SITE_OTHER): Payer: PRIVATE HEALTH INSURANCE | Admitting: Radiology

## 2021-09-21 ENCOUNTER — Encounter: Payer: Self-pay | Admitting: Radiology

## 2021-09-21 VITALS — BP 120/82 | Ht 64.0 in | Wt 238.0 lb

## 2021-09-21 DIAGNOSIS — Z01419 Encounter for gynecological examination (general) (routine) without abnormal findings: Secondary | ICD-10-CM

## 2021-09-21 DIAGNOSIS — Z30431 Encounter for routine checking of intrauterine contraceptive device: Secondary | ICD-10-CM | POA: Diagnosis not present

## 2021-09-21 NOTE — Progress Notes (Signed)
   Anna Vargas [birth year] [id number]   History:  31 y.o. G0 presents for annual exam. Would like to discuss Mirena replacement. On year 6, was previously amenorrheic now periods have returned. No other GYN concerns.  Gynecologic History Patient's last menstrual period was 09/07/2021 (approximate).   Contraception/Family planning: IUD Sexually active: yes Last Pap: 2021. Results were: normal Last mammogram: n/a.   Obstetric History OB History  Gravida Para Term Preterm AB Living  0 0 0 0 0 0  SAB IAB Ectopic Multiple Live Births  0 0 0 0 0     The following portions of the patient's history were reviewed and updated as appropriate: allergies, current medications, past family history, past medical history, past social history, past surgical history, and problem list.  Review of Systems Pertinent items noted in HPI and remainder of comprehensive ROS otherwise negative.   Past medical history, past surgical history, family history and social history were all reviewed and documented in the EPIC chart.   Exam:  Vitals:   09/21/21 1512  BP: 120/82  Weight: 238 lb (108 kg)  Height: 5\' 4"  (1.626 m)   Body mass index is 40.85 kg/m.  General appearance:  Normal, obese Thyroid:  Symmetrical, normal in size, without palpable masses or nodularity. Respiratory  Auscultation:  Clear without wheezing or rhonchi Cardiovascular  Auscultation:  Regular rate, without rubs, murmurs or gallops  Edema/varicosities:  Not grossly evident Abdominal  Soft,nontender, without masses, guarding or rebound.  Liver/spleen:  No organomegaly noted  Hernia:  None appreciated  Skin  Inspection:  Grossly normal Breasts: Examined lying and sitting.   Right: Without masses, retractions, nipple discharge or axillary adenopathy.   Left: Without masses, retractions, nipple discharge or axillary adenopathy. Genitourinary   Inguinal/mons:  Normal without inguinal adenopathy  External genitalia:   Normal appearing vulva with no masses, tenderness, or lesions  BUS/Urethra/Skene's glands:  Normal without masses or exudate  Vagina:  Normal appearing with normal color and discharge, no lesions  Cervix:  Normal appearing without discharge or lesions. IUD strings seen   Uterus:  Normal in size, shape and contour.  Mobile, nontender  Adnexa/parametria:     Rt: Normal in size, without masses or tenderness.   Lt: Normal in size, without masses or tenderness.  Anus and perineum: Normal   Patient informed chaperone available to be present for breast and pelvic exam. Patient has requested no chaperone to be present. Patient has been advised what will be completed during breast and pelvic exam.   Assessment/Plan:   1. Well woman exam with routine gynecological exam Pap due 2021  2. Encounter for routine checking of intrauterine contraceptive device (IUD) Would like to replaced early since cycles have returned - IUD Insertion; Future     Discussed SBE, pap and STI screening as directed/appropriate. Recommend 2022 of exercise weekly, including weight bearing exercise. Encouraged the use of seatbelts and sunscreen. Return in 1 year for annual or as needed.   B WHNP-BC 3:40 PM 09/21/2021

## 2021-10-10 ENCOUNTER — Encounter: Payer: Self-pay | Admitting: Radiology

## 2021-10-10 ENCOUNTER — Ambulatory Visit (INDEPENDENT_AMBULATORY_CARE_PROVIDER_SITE_OTHER): Payer: PRIVATE HEALTH INSURANCE | Admitting: Radiology

## 2021-10-10 VITALS — BP 122/78

## 2021-10-10 DIAGNOSIS — Z30433 Encounter for removal and reinsertion of intrauterine contraceptive device: Secondary | ICD-10-CM

## 2021-10-10 DIAGNOSIS — Z01812 Encounter for preprocedural laboratory examination: Secondary | ICD-10-CM

## 2021-10-10 DIAGNOSIS — Z30431 Encounter for routine checking of intrauterine contraceptive device: Secondary | ICD-10-CM

## 2021-10-10 NOTE — Progress Notes (Signed)
   Anna Vargas 1991-03-02 [id number]   History:  31 y.o. G0 presents for replacement of Mirena IUD.  Pt has been counseled about risks and benefits as well as complications.  Consent is obtained today.  Patient's last menstrual period was 09/07/2021 (approximate). GC/CT/Trich testing: obtained  Past medical history, past surgical history, family history and social history were all reviewed and documented in the EPIC chart.  ROS:  A ROS was performed and pertinent positives and negatives are included.  Exam: Vitals:   10/10/21 1431  BP: 122/78   There is no height or weight on file to calculate BMI.  Pelvic exam: Vulva:  normal female genitalia Vagina:  normal vagina, no discharge, exudate, lesion, or erythema Cervix:  Non-tender, Negative CMT, no lesions or redness. Uterus:  normal shape, position and consistency    Procedure:  Speculum inserted.   Cervix visualized and cleansed with Betadine x 3. IUD removed with ring forceps with gentle traction.  Tenaculum placed on anterior cervix. Then uterus sounded to 8 cm. IUD inserted easily. Strings trimmed to 2 cm.  Minimal bleeding noted.  Pt tolerated the procedure well.  Chaperone present: Raynelle Fanning, CMA   Assessment/Plan: 1. Encounter for removal and reinsertion of intrauterine contraceptive device (IUD)  2. Encounter for preprocedural laboratory examination  - SURESWAB CT/NG/T. vaginalis   Return for recheck 4-6 weeks Pt aware to call for any concerns Pt aware removal due no later than 10/10/2029, IUD card given to pt.   Deanta Mincey B WHNP-BC, 2:50 PM 10/10/2021

## 2021-10-11 LAB — SURESWAB CT/NG/T. VAGINALIS
C. trachomatis RNA, TMA: NOT DETECTED
N. gonorrhoeae RNA, TMA: NOT DETECTED
Trichomonas vaginalis RNA: NOT DETECTED

## 2021-11-20 ENCOUNTER — Ambulatory Visit (INDEPENDENT_AMBULATORY_CARE_PROVIDER_SITE_OTHER): Payer: PRIVATE HEALTH INSURANCE | Admitting: Radiology

## 2021-11-20 ENCOUNTER — Encounter: Payer: Self-pay | Admitting: Radiology

## 2021-11-20 VITALS — BP 116/72

## 2021-11-20 DIAGNOSIS — Z30431 Encounter for routine checking of intrauterine contraceptive device: Secondary | ICD-10-CM | POA: Diagnosis not present

## 2021-11-20 NOTE — Progress Notes (Signed)
     History:  31 y.o. G0P0000 here today for today for IUD string check; Mirena IUD was placed  10/10/21. No complaints about the IUD, no concerning side effects.  The following portions of the patient's history were reviewed and updated as appropriate: allergies, current medications, past family history, past medical history, past social history, past surgical history and problem list.  Review of Systems:  Pertinent items are noted in HPI.   Objective:  Physical Exam Blood pressure 116/72. Gen: NAD Abd: Soft, nontender and nondistended Pelvic: Normal appearing external genitalia; normal appearing vaginal mucosa and cervix.  IUD strings visualized, about 3 cm in length outside cervix.  Chaperone offered and declined.  Assessment & Plan:  Normal IUD check. Patient may keep IUD in place for up to 8 years. May remover sooner  if she desires pregnancy, or has side effects within that time.   Rubbie Battiest, [facility]

## 2021-12-10 ENCOUNTER — Encounter (HOSPITAL_COMMUNITY): Payer: Self-pay

## 2021-12-10 ENCOUNTER — Ambulatory Visit (INDEPENDENT_AMBULATORY_CARE_PROVIDER_SITE_OTHER): Payer: 59

## 2021-12-10 ENCOUNTER — Ambulatory Visit (HOSPITAL_COMMUNITY)
Admission: EM | Admit: 2021-12-10 | Discharge: 2021-12-10 | Disposition: A | Discharge status: Home / Self Care | Payer: 59 | Attending: Internal Medicine | Admitting: Internal Medicine

## 2021-12-10 DIAGNOSIS — M79672 Pain in left foot: Secondary | ICD-10-CM | POA: Diagnosis not present

## 2021-12-10 DIAGNOSIS — S93602A Unspecified sprain of left foot, initial encounter: Secondary | ICD-10-CM | POA: Diagnosis not present

## 2021-12-10 DIAGNOSIS — W19XXXA Unspecified fall, initial encounter: Secondary | ICD-10-CM

## 2021-12-10 NOTE — Discharge Instructions (Signed)
Apply a compressive ACE bandage. Please wear well-padded shoes or shoe inserts. Rest and elevate the affected painful area.   Apply cold compresses intermittently as needed.   As pain recedes, begin normal activities slowly as tolerated.   Please return to urgent care if symptoms persist.

## 2021-12-10 NOTE — ED Triage Notes (Signed)
Patient with c/o foot pain after falling a week ago. States it had started to feel better until she went hiking yesterday. States she tried ice and ibuprofen with no relief.

## 2021-12-10 NOTE — ED Provider Notes (Signed)
[facility]    CSN: [id number] Arrival date & time: 12/10/21  1856      History   Chief Complaint Chief Complaint  Patient presents with   Foot Pain    HPI Anna Vargas is a 31 y.o. female was to the urgent care with left foot pain which worsened yesterday.  Patient had a mechanical fall last week and experienced sharp left foot pain.  Pain improved over the past several days.  Patient went hiking yesterday and after returning from hiking she noticed worsening left foot pain.  Mild swelling of the left foot.  Pain is of moderate severity, aggravated by bearing weight with no known relieving factors.  No bruising on the left foot.  Patient's hiking shoes are comfortable and she has insole inserts.  No numbness or tingling.  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 Authorizing Provider  ALPRAZolam Prudy Feeler) 0.5 MG tablet Take 0.5 mg by mouth as needed. 10/10/20   [provider]  buPROPion (WELLBUTRIN XL) 300 MG 24 hr tablet Take 300 mg by mouth every morning. 08/11/20   [provider]  citalopram (CELEXA) 40 MG tablet Take 40 mg by mouth at bedtime. 08/11/20   [provider]  glycopyrrolate (ROBINUL) 1 MG tablet Take 1 mg by mouth 3 (three) times daily as needed. 05/16/21   [provider]  levonorgestrel (MIRENA) 20 MCG/DAY IUD by Intrauterine route. INSERTED 10/10/21 10/10/21   [provider]  Methylphenidate HCl ER, XR, 15 MG CP24 Take 1 capsule by mouth every morning. 09/25/21   [provider]  spironolactone (ALDACTONE) 50 MG tablet Take by mouth. 09/13/21   [provider]  VRAYLAR 3 MG capsule Take 3 mg by mouth at bedtime. 09/06/21   [provider]    Family History Family History  Problem Relation Age of Onset   Hypertension Mother    Hyperlipidemia Father    Hypertension Father    Breast cancer Maternal Aunt 54 - 13   Lung cancer Maternal Grandfather     Social History Social History   Tobacco Use   Smoking status: Never   Smokeless tobacco: Never  Substance Use Topics   Alcohol use: Yes    Alcohol/week: 2.0 standard drinks of alcohol    Types: 2 Standard drinks or equivalent per week   Drug use: No  Allergies   Latex   Review of Systems Review of Systems  Respiratory: Negative.    Gastrointestinal: Negative.   Musculoskeletal:  Positive for arthralgias and joint swelling. Negative for myalgias, neck pain and neck stiffness.  Skin: Negative.  Negative for color change, rash and wound.  Neurological: Negative.      Physical Exam Triage Vital Signs ED Triage Vitals [12/10/21 0834]  Enc Vitals Group     BP 105/71     Pulse Rate 99     Resp 16     Temp 98.3 F (36.8 C)     Temp Source Oral     SpO2 98 %     Weight      Height      Head Circumference      Peak Flow      Pain Score      Pain Loc      Pain Edu?      Excl. in [place]?    No data found.  Updated Vital Signs BP 105/71 (BP Location: Left Arm)   Pulse 99   Temp 98.3 F (36.8 C) (Oral)   Resp 16   LMP  (Exact Date)   SpO2 98%   Visual Acuity Right Eye Distance:   Left Eye Distance:    Bilateral Distance:    Right Eye Near:   Left Eye Near:    Bilateral Near:     Physical Exam Vitals and nursing note reviewed.  Constitutional:      General: She is not in acute distress.    Appearance: She is not ill-appearing.  Cardiovascular:     Rate and Rhythm: Normal rate and regular rhythm.     Pulses: Normal pulses.     Heart sounds: Normal heart sounds.  Pulmonary:     Effort: Pulmonary effort is normal.     Breath sounds: Normal breath sounds.  Musculoskeletal:        General: Swelling and tenderness present. No deformity or signs of injury.     Left lower leg: No edema.  Skin:    General: Skin is warm.     Capillary Refill: Capillary refill takes less than 2 seconds.     Coloration: Skin is not jaundiced.     Findings: No bruising or erythema.  Neurological:     Mental Status: She is alert.      UC Treatments / Results  Labs (all labs ordered are listed, but only abnormal results are displayed) Labs Reviewed - No data to display  EKG   Radiology DG Foot Complete Left  Result Date: 12/10/2021 CLINICAL DATA:  Pain, recent fall EXAM: LEFT FOOT - COMPLETE 3+ VIEW COMPARISON:  Left ankle done on 01/16/2021 FINDINGS: No recent fracture or dislocation is seen. Small smooth marginated calcifications noted in the dorsal aspect of anterior talus may suggest old avulsions. Small bony spurs seen in first metatarsophalangeal joint. IMPRESSION: No recent fracture or dislocation is seen. Small smooth marginated calcifications noted adjacent to the dorsal aspect of anterior talus may be residual from previous injury. Small bony spurs seen in first metatarsophalangeal joint. Electronically Signed   By: Elmer Picker M.D.   On: 12/10/2021 09:00    Procedures Procedures (including critical care time)  Medications Ordered in UC Medications - No data to display  Initial Impression / Assessment and Plan / UC Course  I have reviewed the triage vital signs and the  nursing notes.  Pertinent labs & imaging results that were available  during my care of the patient were reviewed by me and considered in my medical decision making (see chart for details).     1.  Left foot sprain: X-ray of the left foot is negative for fracture of the left foot. Ibuprofen as needed for pain Elevation of the left foot. Gentle range of motion exercises Icing of the left foot. Return precautions given Final Clinical Impressions(s) / UC Diagnoses   Final diagnoses:  Foot sprain, left, initial encounter     Discharge Instructions      Apply a compressive ACE bandage. Please wear well-padded shoes or shoe inserts. Rest and elevate the affected painful area.   Apply cold compresses intermittently as needed.   As pain recedes, begin normal activities slowly as tolerated.   Please return to urgent care if symptoms persist.     ED Prescriptions   None    PDMP not reviewed this encounter.   Merrilee Jansky, MD 12/10/21 [phone]

## 2021-12-13 ENCOUNTER — Ambulatory Visit (INDEPENDENT_AMBULATORY_CARE_PROVIDER_SITE_OTHER): Payer: 59 | Admitting: Nurse Practitioner

## 2021-12-13 DIAGNOSIS — F3342 Major depressive disorder, recurrent, in full remission: Secondary | ICD-10-CM

## 2021-12-13 DIAGNOSIS — F419 Anxiety disorder, unspecified: Secondary | ICD-10-CM

## 2021-12-13 DIAGNOSIS — R61 Generalized hyperhidrosis: Secondary | ICD-10-CM

## 2021-12-14 LAB — COMPREHENSIVE METABOLIC PANEL
ALT: 28 IU/L (ref 0–32)
AST: 24 IU/L (ref 0–40)
Albumin/Globulin Ratio: 1.8 (ref 1.2–2.2)
Albumin: 4.3 g/dL (ref 3.9–4.9)
Alkaline Phosphatase: 95 IU/L (ref 44–121)
BUN/Creatinine Ratio: 12 (ref 9–23)
BUN: 9 mg/dL (ref 6–20)
Bilirubin Total: <0.2 mg/dL (ref 0.0–1.2)
CO2: 23 mmol/L (ref 20–29)
Calcium: 9.7 mg/dL (ref 8.7–10.2)
Chloride: 101 mmol/L (ref 96–106)
Creatinine, Ser: 0.73 mg/dL (ref 0.57–1.00)
Globulin, Total: 2.4 g/dL (ref 1.5–4.5)
Glucose: 103 mg/dL — ABNORMAL HIGH (ref 70–99)
Potassium: 4.4 mmol/L (ref 3.5–5.2)
Sodium: 140 mmol/L (ref 134–144)
Total Protein: 6.7 g/dL (ref 6.0–8.5)
eGFR: 112 mL/min/{1.73_m2} (ref >59)

## 2021-12-14 LAB — LP+LDL DIRECT
Cholesterol, Total: 217 mg/dL — ABNORMAL HIGH (ref 100–199)
HDL: 35 mg/dL — ABNORMAL LOW (ref >39)
LDL Chol Calc (NIH): 102 mg/dL — ABNORMAL HIGH (ref 0–99)
LDL Direct: 124 mg/dL — ABNORMAL HIGH (ref 0–99)
Triglycerides: 473 mg/dL — ABNORMAL HIGH (ref 0–149)
VLDL Cholesterol Cal: 80 mg/dL — ABNORMAL HIGH (ref 5–40)

## 2021-12-14 LAB — VITAMIN D 25 HYDROXY (VIT D DEFICIENCY, FRACTURES): Vit D, 25-Hydroxy: 31.7 ng/mL (ref 30.0–100.0)

## 2021-12-14 LAB — CBC WITH DIFF/PLATELET
Basophils Absolute: 0.1 10*3/uL (ref 0.0–0.2)
Basos: 1 %
EOS (ABSOLUTE): 0.8 10*3/uL — ABNORMAL HIGH (ref 0.0–0.4)
Eos: 7 %
Hematocrit: 42.1 % (ref 34.0–46.6)
Hemoglobin: 14.2 g/dL (ref 11.1–15.9)
Immature Grans (Abs): 0 10*3/uL (ref 0.0–0.1)
Immature Granulocytes: 0 %
Lymphocytes Absolute: 3.1 10*3/uL (ref 0.7–3.1)
Lymphs: 27 %
MCH: 29.3 pg (ref 26.6–33.0)
MCHC: 33.7 g/dL (ref 31.5–35.7)
MCV: 87 fL (ref 79–97)
Monocytes Absolute: 0.9 10*3/uL (ref 0.1–0.9)
Monocytes: 8 %
Neutrophils Absolute: 6.3 10*3/uL (ref 1.4–7.0)
Neutrophils: 57 %
Platelets: 360 10*3/uL (ref 150–450)
RBC: 4.84 x10E6/uL (ref 3.77–5.28)
RDW: 12.7 % (ref 11.7–15.4)
WBC: 11.2 10*3/uL — ABNORMAL HIGH (ref 3.4–10.8)

## 2021-12-14 LAB — IRON,TIBC AND FERRITIN PANEL
Ferritin: 101 ng/mL (ref 15–150)
Iron Saturation: 22 % (ref 15–55)
Iron: 77 ug/dL (ref 27–159)
Total Iron Binding Capacity: 348 ug/dL (ref 250–450)
UIBC: 271 ug/dL (ref 131–425)

## 2021-12-14 LAB — B12 AND FOLATE PANEL
Folate: 13.3 ng/mL (ref >3.0)
Vitamin B-12: 589 pg/mL (ref 232–1245)

## 2021-12-14 LAB — HEMOGLOBIN A1C
Est. average glucose Bld gHb Est-mCnc: 111 mg/dL
Hgb A1c MFr Bld: 5.5 % (ref 4.8–5.6)

## 2021-12-14 LAB — THYROID PANEL WITH TSH
Free Thyroxine Index: 1.2 (ref 1.2–4.9)
T3 Uptake Ratio: 22 % — ABNORMAL LOW (ref 24–39)
T4, Total: 5.5 ug/dL (ref 4.5–12.0)
TSH: 1.96 u[IU]/mL (ref 0.450–4.500)

## 2021-12-14 LAB — LIPID PANEL: Chol/HDL Ratio: 6.2 ratio — ABNORMAL HIGH (ref 0.0–4.4)

## 2022-01-01 NOTE — Progress Notes (Signed)
Anna Vargas presents for labs only visit today.

## 2022-01-02 ENCOUNTER — Ambulatory Visit (INDEPENDENT_AMBULATORY_CARE_PROVIDER_SITE_OTHER): Payer: Managed Care, Other (non HMO) | Admitting: Podiatry

## 2022-01-02 DIAGNOSIS — M76822 Posterior tibial tendinitis, left leg: Secondary | ICD-10-CM

## 2022-01-02 MED ORDER — BETAMETHASONE SOD PHOS & ACET 6 (3-3) MG/ML IJ SUSP
3.0000 mg | Freq: Once | INTRAMUSCULAR | Status: AC
  Administered 2022-01-02: 3 mg via INTRA_ARTICULAR

## 2022-01-02 NOTE — Progress Notes (Signed)
   Chief Complaint  Patient presents with   Foot Pain    Left foot pain. Patient had a steroid injection given at there last appointment and it was effective. Patient is not taking meloxicam.     HPI: 31 y.o. female presenting today for follow-up evaluation of posterior tibial tendinitis to the left ankle.  Patient does have a history of left posterior tibial tendon surgery 2019.  Patient was last seen in the office January 2023 and she says the injection helped beautifully.  She had no pain or tenderness up until about 2 weeks ago.  Overall she is doing very well with no new complaints at this time  Past Medical History:  Diagnosis Date   Anxiety    Depression     Past Surgical History:  Procedure Laterality Date   ANKLE SURGERY Left    2021   KNEE SURGERY  03/04/2010    Allergies  Allergen Reactions   Latex Anaphylaxis     Physical Exam: General: The patient is alert and oriented x3 in no acute distress.  Dermatology: Skin is warm, dry and supple bilateral lower extremities. Negative for open lesions or macerations.  Vascular: Palpable pedal pulses bilaterally. Capillary refill within normal limits.  Negative for any significant edema or erythema  Neurological: Light touch and protective threshold grossly intact  Musculoskeletal Exam: No pedal deformities noted.  Moderate tenderness to palpation along the posterior tibial tendon just inferior to the medial malleolus  MR ANKLE LT WO CONTRAST 03/07/2021: IMPRESSION: 1. Postsurgical changes involving the PT tendon, with anchor in the navicular. Mild tendinosis of the inframalleolar PT tendon. No acute tendon tear. 2. Intact ankle ligaments.  No acute osseous abnormality.  Assessment: 1. H/o PT tendon surgery with anchor into the navicular.  2019 in [place] 2.  Posterior tibial tendinitis/tendinosis left   Plan of Care:  1. Patient evaluated.   2.  Injection of 0.5 cc Celestone Soluspan injected along the posterior  tibial tendon left just inferior to the medial malleolus 3.  Continue meloxicam 15 mg as needed  4.  Continue OTC arch supports.  The patient has gone through 6 pairs of custom molded orthotics with no improvement previously 5.  Continue ankle brace as needed.  The patient has 3 different ankle braces that she has tried 6.  Return to clinic as needed  *Works at [employer]. [employer]      Edrick Kins, DPM [facility]  Dr. Edrick Kins, DPM    [address], [place] [address]                Office [phone]  Fax [phone]

## 2022-01-08 ENCOUNTER — Other Ambulatory Visit (HOSPITAL_BASED_OUTPATIENT_CLINIC_OR_DEPARTMENT_OTHER): Payer: Self-pay

## 2022-03-26 ENCOUNTER — Telehealth: Payer: Self-pay | Admitting: Nurse Practitioner

## 2022-03-26 NOTE — Telephone Encounter (Signed)
Received request for Medical records from [facility]. Forwarded to [employer].

## 2022-04-10 ENCOUNTER — Ambulatory Visit: Payer: Managed Care, Other (non HMO) | Admitting: Family Medicine

## 2022-04-10 ENCOUNTER — Encounter: Payer: Self-pay | Admitting: Family Medicine

## 2022-04-10 VITALS — BP 120/78 | HR 110 | Temp 97.8°F | Ht 64.5 in | Wt 245.4 lb

## 2022-04-10 DIAGNOSIS — E559 Vitamin D deficiency, unspecified: Secondary | ICD-10-CM | POA: Diagnosis not present

## 2022-04-10 DIAGNOSIS — R5383 Other fatigue: Secondary | ICD-10-CM

## 2022-04-10 DIAGNOSIS — F3342 Major depressive disorder, recurrent, in full remission: Secondary | ICD-10-CM | POA: Diagnosis not present

## 2022-04-10 DIAGNOSIS — F419 Anxiety disorder, unspecified: Secondary | ICD-10-CM | POA: Diagnosis not present

## 2022-04-10 DIAGNOSIS — Z1159 Encounter for screening for other viral diseases: Secondary | ICD-10-CM

## 2022-04-10 DIAGNOSIS — Z13220 Encounter for screening for lipoid disorders: Secondary | ICD-10-CM

## 2022-04-10 DIAGNOSIS — R61 Generalized hyperhidrosis: Secondary | ICD-10-CM

## 2022-04-10 DIAGNOSIS — Z6841 Body Mass Index (BMI) 40.0 and over, adult: Secondary | ICD-10-CM

## 2022-04-10 MED ORDER — GLYCOPYRROLATE 2 MG PO TABS: 4.0000 mg | ORAL_TABLET | Freq: Every day | ORAL | 3 refills | Status: DC

## 2022-04-10 NOTE — Assessment & Plan Note (Signed)
I will repeat her Vitamin D level. We did discuss the controversy about what represents a low Vit. D level and lack of evidence for benefits to Vit. D replacement.

## 2022-04-10 NOTE — Assessment & Plan Note (Signed)
As above, for management of anxiety.

## 2022-04-10 NOTE — Assessment & Plan Note (Signed)
I will switch her Robinul to the 2 mg tabs, but keep her daily dose the same.

## 2022-04-10 NOTE — Assessment & Plan Note (Signed)
We discussed the importance of a foundation of a reduced calorie diet and regular exercise for any weight loss program. I encouraged her to continue these effort.s We discuss the role of medicaiton management as an adjunct to this foundation. I recommended she check with her insurance company regarding coverage for obesity medications.

## 2022-04-10 NOTE — Progress Notes (Signed)
[facility] [address] [place] [address] Dept: [phone] Dept Fax: [phone]  Transfer of Care Office Visit  Subjective:    Patient ID: Anna Vargas, female    DOB: 22-Oct-1990, 32 y.o..   MRN: [id number]  Chief Complaint  Patient presents with   Establish Care    NP-establish care. C/o having chest congestion x 2 weeks after having flu(2 weeks ago),     History of Present Illness:  Patient is in today to establish care. Ms. Hammad was born in [place]. She attended [employer] in [place], [place] in Scientist, clinical (histocompatibility and immunogenetics). She then attended [employer] for pharmacy school. She only completed 2 years of this training, due to severe depression.  She returned to [place] int he past few years. 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 She denies tobacco or drug use. She rarely drinks alcohol.  Ms. Moravek has a history of anxiety and major depression. She is followed by Silvio Pate, PA-C. She is currently managed on Auvelity, citalopram, Vraylar, and Adderall. She also takes Xanax about once a month. She feels her mood issues are overall well managed. She has had some recent issues with feeling very low energy. She notes her psych provider wanted her to be screened for potential medical causes, before making a medication change.  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 She has been managed on Robinul 1 mg, 4 tabs daily. She would prefer to have fewer tabs to keep up with.  Ms. Koenigsberg notes that she has struggled with obesity ever since she was started on antipsychotics. she does currently count calories and is going tot he gym on a regular basis. She has wanted to consider using a weight loss medicine, but was unsure of their safety.  Past Medical History: Patient Active Problem List   Diagnosis Date Noted   Anxiety 04/03/2021   Morbid obesity with BMI of 40.0-44.9, adult ([place])  04/03/2021   Pure hypercholesterolemia 04/03/2021   Recurrent major depression in full remission ([place]) 04/03/2021   Vitamin D deficiency 04/03/2021   Hyperhidrosis 04/03/2021   Arthritis of left knee 04/03/2021   Past Surgical History:  Procedure Laterality Date   ANKLE SURGERY Left    2021   KNEE SURGERY  03/04/2010   Family History  Problem Relation Age of Onset   Hypertension Mother    Hyperlipidemia Father    Hypertension Father    Breast cancer Maternal Aunt 26 - 49   Lung cancer Maternal Grandfather    Heart disease Paternal Grandfather    Outpatient Medications Prior to Visit  Medication Sig Dispense Refill   albuterol (VENTOLIN HFA) 108 (90 Base) MCG/ACT inhaler Inhale 2 puffs into the lungs every 4 (four) hours as needed.     ALPRAZolam (XANAX) 0.5 MG tablet Take 0.5 mg by mouth as needed.     amphetamine-dextroamphetamine (ADDERALL XR) 20 MG 24 hr capsule Take by mouth every morning.     citalopram (CELEXA) 40 MG tablet Take 40 mg by mouth at bedtime.     Dextromethorphan-buPROPion ER (AUVELITY) 45-105 MG TBCR Take 1 tablet by mouth in the morning and at bedtime.     glycopyrrolate (ROBINUL) 1 MG tablet Take 1 mg by mouth 3 (three) times daily as needed.     levonorgestrel (MIRENA) 20 MCG/DAY IUD by Intrauterine route. INSERTED 10/10/21     VRAYLAR 3 MG capsule Take 3 mg by mouth at bedtime.  buPROPion (WELLBUTRIN XL) 300 MG 24 hr tablet Take 300 mg by mouth every morning.     Methylphenidate HCl ER, XR, 15 MG CP24 Take 1 capsule by mouth every morning.     spironolactone (ALDACTONE) 50 MG tablet Take by mouth.     No facility-administered medications prior to visit.   Allergies  Allergen Reactions   Latex Anaphylaxis      Objective:   Today's Vitals   04/10/22 1429  BP: 120/78  Pulse: (!) 110  Temp: 97.8 F (36.6 C)  TempSrc: Temporal  SpO2: 98%  Weight: 245 lb 6.4 oz (111.3 kg)  Height: 5' 4.5" (1.638 m)   Body mass index is 41.47 kg/m.    General: Well developed, well nourished. No acute distress. Psych: Alert and oriented. Normal mood and affect.  Health Maintenance Due  Topic Date Due   HIV Screening  Never done   Hepatitis C Screening  Never done   PAP SMEAR-Modifier  Never done     Assessment & Plan:  Anxiety Assessment & Plan: Ms. Quesinberry will continue her current medication regimen, managed by her Psych PA.   Recurrent major depression in full remission [facility]) Assessment & Plan: As above, for management of anxiety.   Hyperhidrosis Assessment & Plan: I will switch her Robinul to the 2 mg tabs, but keep her daily dose the same.  Orders: -     Glycopyrrolate; Take 2 tablets (4 mg total) by mouth daily.  Dispense: 180 tablet; Refill: 3  Vitamin D deficiency Assessment & Plan: I will repeat her Vitamin D level. We did discuss the controversy about what represents a low Vit. D level and lack of evidence for benefits to Vit. D replacement.  Orders: -     VITAMIN D 25 Hydroxy (Vit-D Deficiency, Fractures); Future  Encounter for hepatitis C screening test for low risk patient -     HCV Ab w Reflex to Quant PCR; Future  Low energy Assessment & Plan: I will do screening labs for common causes of low energy.  Orders: -     TSH; Future -     T4, free; Future -     Comprehensive metabolic panel; Future -     CBC; Future  Morbid obesity with BMI of 40.0-44.9, adult [facility]) Assessment & Plan: We discussed the importance of a foundation of a reduced calorie diet and regular exercise for any weight loss program. I encouraged her to continue these effort.s We discuss the role of medicaiton management as an adjunct to this foundation. I recommended she check with her insurance company regarding coverage for obesity medications.   Screening for lipid disorders -     Lipid panel; Future   Return in about 6 months (around 10/09/2022) for Reassessment.   Haydee Salter, MD

## 2022-04-10 NOTE — Assessment & Plan Note (Signed)
Ms. Mcginness will continue her current medication regimen, managed by her Psych PA.

## 2022-04-10 NOTE — Assessment & Plan Note (Signed)
I will do screening labs for common causes of low energy.

## 2022-04-15 ENCOUNTER — Other Ambulatory Visit (INDEPENDENT_AMBULATORY_CARE_PROVIDER_SITE_OTHER): Payer: Managed Care, Other (non HMO)

## 2022-04-15 DIAGNOSIS — R5383 Other fatigue: Secondary | ICD-10-CM

## 2022-04-15 DIAGNOSIS — Z13220 Encounter for screening for lipoid disorders: Secondary | ICD-10-CM | POA: Diagnosis not present

## 2022-04-15 DIAGNOSIS — E559 Vitamin D deficiency, unspecified: Secondary | ICD-10-CM | POA: Diagnosis not present

## 2022-04-15 DIAGNOSIS — Z1159 Encounter for screening for other viral diseases: Secondary | ICD-10-CM | POA: Diagnosis not present

## 2022-04-15 LAB — COMPREHENSIVE METABOLIC PANEL
ALT: 22 U/L (ref 0–35)
AST: 19 U/L (ref 0–37)
Albumin: 4.1 g/dL (ref 3.5–5.2)
Alkaline Phosphatase: 76 U/L (ref 39–117)
BUN: 8 mg/dL (ref 6–23)
CO2: 26 mEq/L (ref 19–32)
Calcium: 9.3 mg/dL (ref 8.4–10.5)
Chloride: 103 mEq/L (ref 96–112)
Creatinine, Ser: 0.93 mg/dL (ref 0.40–1.20)
GFR: 81.4 mL/min (ref >60.00)
Glucose, Bld: 91 mg/dL (ref 70–99)
Potassium: 4.1 mEq/L (ref 3.5–5.1)
Sodium: 137 mEq/L (ref 135–145)
Total Bilirubin: 0.6 mg/dL (ref 0.2–1.2)
Total Protein: 6.6 g/dL (ref 6.0–8.3)

## 2022-04-15 LAB — CBC
HCT: 42.5 % (ref 36.0–46.0)
Hemoglobin: 14.5 g/dL (ref 12.0–15.0)
MCHC: 34 g/dL (ref 30.0–36.0)
MCV: 86.7 fl (ref 78.0–100.0)
Platelets: 324 K/uL (ref 150.0–400.0)
RBC: 4.9 Mil/uL (ref 3.87–5.11)
RDW: 13 % (ref 11.5–15.5)
WBC: 9.1 K/uL (ref 4.0–10.5)

## 2022-04-15 LAB — VITAMIN D 25 HYDROXY (VIT D DEFICIENCY, FRACTURES): VITD: 41.44 ng/mL (ref 30.00–100.00)

## 2022-04-15 LAB — LDL CHOLESTEROL, DIRECT: Direct LDL: 124 mg/dL

## 2022-04-15 LAB — LIPID PANEL
Cholesterol: 204 mg/dL — ABNORMAL HIGH (ref 0–200)
HDL: 38.5 mg/dL — ABNORMAL LOW (ref >39.00)
NonHDL: 165.76
Total CHOL/HDL Ratio: 5
Triglycerides: 282 mg/dL — ABNORMAL HIGH (ref 0.0–149.0)
VLDL: 56.4 mg/dL — ABNORMAL HIGH (ref 0.0–40.0)

## 2022-04-15 LAB — TSH: TSH: 2.03 u[IU]/mL (ref 0.35–5.50)

## 2022-04-15 LAB — T4, FREE: Free T4: 0.71 ng/dL (ref 0.60–1.60)

## 2022-04-17 LAB — HCV INTERPRETATION

## 2022-04-17 LAB — HCV AB W REFLEX TO QUANT PCR: HCV Ab: NONREACTIVE

## 2022-04-19 ENCOUNTER — Ambulatory Visit: Payer: 59 | Admitting: Nurse Practitioner

## 2022-04-26 ENCOUNTER — Ambulatory Visit (INDEPENDENT_AMBULATORY_CARE_PROVIDER_SITE_OTHER): Payer: Managed Care, Other (non HMO) | Admitting: Radiology

## 2022-04-26 ENCOUNTER — Ambulatory Visit (INDEPENDENT_AMBULATORY_CARE_PROVIDER_SITE_OTHER): Payer: Managed Care, Other (non HMO) | Admitting: Podiatry

## 2022-04-26 VITALS — BP 114/70

## 2022-04-26 DIAGNOSIS — R52 Pain, unspecified: Secondary | ICD-10-CM

## 2022-04-26 DIAGNOSIS — N39491 Coital incontinence: Secondary | ICD-10-CM | POA: Diagnosis not present

## 2022-04-26 DIAGNOSIS — L600 Ingrowing nail: Secondary | ICD-10-CM

## 2022-04-26 NOTE — Patient Instructions (Signed)

## 2022-04-26 NOTE — Progress Notes (Signed)
   Chief Complaint  Patient presents with   Ingrown Toenail    Right hallux lateral border ingrown toenail removal     Subjective: Patient presents today for evaluation of a new complaint of pain to the lateral border right great toe. Patient is concerned for possible ingrown nail.  It is very sensitive to touch.  Patient has a history of nail matricectomy to this area about 6 years ago.  She also has a history of bilateral ingrown toenails.  Patient presents today for further treatment and evaluation.  Past Medical History:  Diagnosis Date   Anxiety    Depression     Objective:  General: Well developed, nourished, in no acute distress, alert and oriented x3   Dermatology: Skin is warm, dry and supple bilateral.  Lateral border right great toe is tender with evidence of an ingrowing nail. Pain on palpation noted to the border of the nail fold. The remaining nails appear unremarkable at this time. There are no open sores, lesions.  Vascular: DP and PT pulses palpable.  No clinical evidence of vascular compromise  Neruologic: Grossly intact via light touch bilateral.  Musculoskeletal: No pedal deformity noted  Assesement: #1 Paronychia with ingrowing nail lateral border right great toe  Plan of Care:  1. Patient evaluated.  2. Discussed treatment alternatives and plan of care. Explained nail avulsion procedure and post procedure course to patient. 3. Patient opted for permanent partial nail avulsion of the ingrown portion of the nail.  4. Prior to procedure, local anesthesia infiltration utilized using 3 ml of a 50:50 mixture of 2% plain lidocaine and 0.5% plain marcaine in a normal hallux block fashion and a betadine prep performed.  5. Partial permanent nail avulsion with chemical matrixectomy performed using [id number] applications of phenol followed by alcohol flush.  6. Light dressing applied.  Post care instructions provided 7.  Return to clinic as needed  Edrick Kins,  DPM [facility]  Dr. Edrick Kins, DPM    [address], [place] [address]                Office [phone]  Fax [phone]

## 2022-04-26 NOTE — Progress Notes (Signed)
      Subjective: Anna Vargas is a 32 y.o. female who complains of urinary incontinence during intercourse. Noticed x's 1 month. Does not have incontinence any other time. Denies dysuria, frequency, urgency. Has tried Kegels with no improvement. No fecal incontinence, no increased gas.     Review of Systems  All other systems reviewed and are negative.   Past Medical History:  Diagnosis Date   Anxiety    Depression       Objective:  Today's Vitals   04/26/22 0803  BP: 114/70   There is no height or weight on file to calculate BMI.   Physical Exam Vitals and nursing note reviewed.  Constitutional:      Appearance: Normal appearance. She is obese.  Pulmonary:     Effort: Pulmonary effort is normal.  Abdominal:     General: Abdomen is flat.     Palpations: Abdomen is soft.  Genitourinary:    General: Normal vulva.     Vagina: Normal.     Cervix: Normal.     Uterus: Normal.      Adnexa: Right adnexa normal.     Comments: Poor vaginal tone Skin:    General: Skin is warm and dry.  Neurological:     Mental Status: She is alert and oriented to person, place, and time. Mental status is at baseline.  Psychiatric:        Mood and Affect: Mood normal.        Thought Content: Thought content normal.        Judgment: Judgment normal.      Chaperone offered and declined.  Assessment:/Plan:   1. Coital urinary incontinence - referred to [facility] PT for further eval and management - Urinalysis,Complete w/RFL Culture

## 2022-04-28 LAB — URINALYSIS, COMPLETE W/RFL CULTURE
Bilirubin Urine: NEGATIVE
Glucose, UA: NEGATIVE
Hyaline Cast: NONE SEEN /LPF
Ketones, ur: NEGATIVE
Nitrites, Initial: NEGATIVE
Protein, ur: NEGATIVE
Specific Gravity, Urine: 1.02 (ref 1.001–1.035)
pH: 7 (ref 5.0–8.0)

## 2022-04-28 LAB — URINE CULTURE
MICRO NUMBER:: 14606619
Result:: NO GROWTH
SPECIMEN QUALITY:: ADEQUATE

## 2022-04-28 LAB — CULTURE INDICATED

## 2022-05-17 ENCOUNTER — Ambulatory Visit (INDEPENDENT_AMBULATORY_CARE_PROVIDER_SITE_OTHER): Payer: Managed Care, Other (non HMO) | Admitting: Podiatry

## 2022-05-17 DIAGNOSIS — L600 Ingrowing nail: Secondary | ICD-10-CM

## 2022-05-17 NOTE — Progress Notes (Signed)
   Chief Complaint  Patient presents with   Ingrown Toenail    Right hallux ingrown nail check,     Subjective: 32 y.o. female presents today status post permanent nail avulsion procedure of the lateral border right great toe that was performed on 04/26/2022.  Patient doing well.  She does have some slight tenderness but overall she is doing well and she has been soaking her foot and applying antibiotic ointment as instructed  Past Medical History:  Diagnosis Date   Anxiety    Depression     Objective: Neurovascular status intact.  Skin is warm, dry and supple. Nail and respective nail fold appears to be healing appropriately.   Assessment: #1 s/p partial permanent nail matrixectomy lateral border right great toe   Plan of care: #1 patient was evaluated  #2 light debridement of the periungual debris was performed to the border of the respective toe and nail plate using a tissue nipper. #3 patient is to return to clinic on a PRN basis.   Edrick Kins, DPM [facility]  Dr. Edrick Kins, DPM    [address], [place] [address]                Office [phone]  Fax [phone]

## 2022-05-27 ENCOUNTER — Encounter: Payer: Self-pay | Admitting: Family Medicine

## 2022-05-27 ENCOUNTER — Ambulatory Visit (INDEPENDENT_AMBULATORY_CARE_PROVIDER_SITE_OTHER): Payer: Managed Care, Other (non HMO) | Admitting: Family Medicine

## 2022-05-27 VITALS — BP 122/70 | HR 103 | Temp 98.0°F | Ht 64.5 in | Wt 248.4 lb

## 2022-05-27 DIAGNOSIS — M545 Low back pain, unspecified: Secondary | ICD-10-CM

## 2022-05-27 MED ORDER — NAPROXEN 500 MG PO TABS: 500.0000 mg | ORAL_TABLET | Freq: Two times a day (BID) | ORAL | 0 refills | Status: DC

## 2022-05-27 MED ORDER — CYCLOBENZAPRINE HCL 10 MG PO TABS: 10.0000 mg | ORAL_TABLET | Freq: Three times a day (TID) | ORAL | 0 refills | Status: DC | PRN

## 2022-05-27 NOTE — Patient Instructions (Signed)
Acute Back Pain, Adult Acute back pain is sudden and usually short-lived. It is often caused by an injury to the muscles and tissues in the back. The injury may result from: A muscle, tendon, or ligament getting overstretched or torn. Ligaments are tissues that connect bones to each other. Lifting something improperly can cause a back strain. Wear and tear (degeneration) of the spinal disks. Spinal disks are circular tissue that provide cushioning between the bones of the spine (vertebrae). Twisting motions, such as while playing sports or doing yard work. A hit to the back. Arthritis. You may have a physical exam, lab tests, and imaging tests to find the cause of your pain. Acute back pain usually goes away with rest and home care. Follow these instructions at home: Managing pain, stiffness, and swelling Take over-the-counter and prescription medicines only as told by your health care provider. Treatment may include medicines for pain and inflammation that are taken by mouth or applied to the skin, or muscle relaxants. Your health care provider may recommend applying ice during the first 24-48 hours after your pain starts. To do this: Put ice in a plastic bag. Place a towel between your skin and the bag. Leave the ice on for 20 minutes, 2-3 times a day. Remove the ice if your skin turns bright red. This is very important. If you cannot feel pain, heat, or cold, you have a greater risk of damage to the area. If directed, apply heat to the affected area as often as told by your health care provider. Use the heat source that your health care provider recommends, such as a moist heat pack or a heating pad. Place a towel between your skin and the heat source. Leave the heat on for 20-30 minutes. Remove the heat if your skin turns bright red. This is especially important if you are unable to feel pain, heat, or cold. You have a greater risk of getting burned. Activity  Do not stay in bed. Staying in  bed for more than 1-2 days can delay your recovery. Sit up and stand up straight. Avoid leaning forward when you sit or hunching over when you stand. If you work at a desk, sit close to it so you do not need to lean over. Keep your chin tucked in. Keep your neck drawn back, and keep your elbows bent at a 90-degree angle (right angle). Sit high and close to the steering wheel when you drive. Add lower back (lumbar) support to your car seat, if needed. Take short walks on even surfaces as soon as you are able. Try to increase the length of time you walk each day. Do not sit, drive, or stand in one place for more than 30 minutes at a time. Sitting or standing for long periods of time can put stress on your back. Do not drive or use heavy machinery while taking prescription pain medicine. Use proper lifting techniques. When you bend and lift, use positions that put less stress on your back: Bend your knees. Keep the load close to your body. Avoid twisting. Exercise regularly as told by your health care provider. Exercising helps your back heal faster and helps prevent back injuries by keeping muscles strong and flexible. Work with a physical therapist to make a safe exercise program, as recommended by your health care provider. Do any exercises as told by your physical therapist. Lifestyle Maintain a healthy weight. Extra weight puts stress on your back and makes it difficult to have good   posture. Avoid activities or situations that make you feel anxious or stressed. Stress and anxiety increase muscle tension and can make back pain worse. Learn ways to manage anxiety and stress, such as through exercise. General instructions Sleep on a firm mattress in a comfortable position. Try lying on your side with your knees slightly bent. If you lie on your back, put a pillow under your knees. Keep your head and neck in a straight line with your spine (neutral position) when using electronic equipment like  smartphones or pads. To do this: Raise your smartphone or pad to look at it instead of bending your head or neck to look down. Put the smartphone or pad at the level of your face while looking at the screen. Follow your treatment plan as told by your health care provider. This may include: Cognitive or behavioral therapy. Acupuncture or massage therapy. Meditation or yoga. Contact a health care provider if: You have pain that is not relieved with rest or medicine. You have increasing pain going down into your legs or buttocks. Your pain does not improve after 2 weeks. You have pain at night. You lose weight without trying. You have a fever or chills. You develop nausea or vomiting. You develop abdominal pain. Get help right away if: You develop new bowel or bladder control problems. You have unusual weakness or numbness in your arms or legs. You feel faint. These symptoms may represent a serious problem that is an emergency. Do not wait to see if the symptoms will go away. Get medical help right away. Call your local emergency services (911 in the [place] Do not drive yourself to the hospital. Summary Acute back pain is sudden and usually short-lived. Use proper lifting techniques. When you bend and lift, use positions that put less stress on your back. Take over-the-counter and prescription medicines only as told by your health care provider, and apply heat or ice as told. This information is not intended to replace advice given to you by your health care provider. Make sure you discuss any questions you have with your health care provider. Document Revised: 05/12/2020 Document Reviewed: 05/12/2020 Elsevier Patient Education  2023 Elsevier Inc.  

## 2022-05-27 NOTE — Progress Notes (Signed)
[facility] [address] [place] [address] Dept: [phone] Dept Fax: [phone]  Office Visit  Subjective:    Patient ID: Anna Vargas, female    DOB: [birth year], 32 y.o..   MRN: [id number]  Chief Complaint  Patient presents with   Back Pain    C/o having lower back pain x years but last 3-4 days worse.     History of Present Illness:  Patient is in today complaining of a 4-day history of low back pain. She notes she has had a past history of Si joint inflammation. She last did PT for this 4-5 years ago. She is engaged in home exercises. She is not aware of any activity that may have brought this on. Since the pain increased, she has used heat, stretches, and ibuprofen 400 mg q 6 hours. She notes her pain is worse than usual. She denies any fever, unintended weight loss, sciatica, or change in bowel or bladder function.  Past Medical History: Patient Active Problem List   Diagnosis Date Noted   Low energy 04/10/2022   ETD (Eustachian tube dysfunction), bilateral 07/16/2021   Anxiety 04/03/2021   Morbid obesity with BMI of 40.0-44.9, adult ([place]) 04/03/2021   Pure hypercholesterolemia 04/03/2021   Recurrent major depression in full remission ([place]) 04/03/2021   Vitamin D deficiency 04/03/2021   Hyperhidrosis 04/03/2021   Arthritis of left knee 04/03/2021   Past Surgical History:  Procedure Laterality Date   ANKLE SURGERY Left    2021   KNEE SURGERY  03/04/2010   Family History  Problem Relation Age of Onset   Hypertension Mother    Hyperlipidemia Father    Hypertension Father    Breast cancer Maternal Aunt 50 - 49   Lung cancer Maternal Grandfather    Heart disease Paternal Grandfather    Outpatient Medications Prior to Visit  Medication Sig Dispense Refill   ALPRAZolam (XANAX) 0.5 MG tablet Take 0.5 mg by mouth as needed.     amphetamine-dextroamphetamine (ADDERALL XR) 20 MG 24 hr capsule Take by mouth  every morning.     buPROPion ER (WELLBUTRIN SR) 100 MG 12 hr tablet Take 100 mg by mouth every morning.     citalopram (CELEXA) 40 MG tablet Take 40 mg by mouth at bedtime.     Dextromethorphan-buPROPion ER (AUVELITY) 45-105 MG TBCR Take 1 tablet by mouth in the morning and at bedtime.     glycopyrrolate (ROBINUL) 2 MG tablet Take 2 tablets (4 mg total) by mouth daily. 180 tablet 3   levonorgestrel (MIRENA) 20 MCG/DAY IUD by Intrauterine route. INSERTED 10/10/21     VRAYLAR 3 MG capsule Take 3 mg by mouth at bedtime.     No facility-administered medications prior to visit.   Allergies  Allergen Reactions   Latex Anaphylaxis     Objective:   Today's Vitals   05/27/22 0757  BP: 122/70  Pulse: (!) 103  Temp: 98 F (36.7 C)  TempSrc: Temporal  SpO2: 98%  Weight: 248 lb 6.4 oz (112.7 kg)  Height: 5' 4.5" (1.638 m)   Body mass index is 41.98 kg/m.   General: Well developed, well nourished. No acute distress. Back: Straight. Pain over the left paralumbar muscles and down to the left SI area. No pain on palpation on the right. Extremities: SLR -. Strength 5/5, sensation normal. DTR 2+ bilat. Psych: Alert and oriented. Normal mood and affect.  Health Maintenance Due  Topic Date Due   HIV  Screening  Never done   PAP SMEAR-Modifier  Never done     Assessment & Plan:   Problem List Items Addressed This Visit   None Visit Diagnoses     Acute left-sided low back pain without sciatica    -  Primary   Likely musculoskeletal origin. Recommend ongoing use of heat, and stretches. Iw ill add naproxen and Flexeril.   Relevant Medications   naproxen (NAPROSYN) 500 MG tablet   cyclobenzaprine (FLEXERIL) 10 MG tablet      Return in about 2 weeks (around 06/10/2022), or if symptoms worsen or fail to improve.   Haydee Salter, MD

## 2022-05-28 ENCOUNTER — Emergency Department (HOSPITAL_COMMUNITY)
Admission: EM | Admit: 2022-05-28 | Discharge: 2022-05-28 | Disposition: A | Discharge status: Home / Self Care | Payer: Managed Care, Other (non HMO) | Attending: Emergency Medicine | Admitting: Emergency Medicine

## 2022-05-28 ENCOUNTER — Emergency Department (HOSPITAL_COMMUNITY): Payer: Managed Care, Other (non HMO)

## 2022-05-28 ENCOUNTER — Encounter (HOSPITAL_COMMUNITY): Payer: Self-pay | Admitting: Emergency Medicine

## 2022-05-28 ENCOUNTER — Other Ambulatory Visit: Payer: Self-pay

## 2022-05-28 DIAGNOSIS — U071 COVID-19: Secondary | ICD-10-CM

## 2022-05-28 DIAGNOSIS — Z91040 Latex allergy status: Secondary | ICD-10-CM | POA: Insufficient documentation

## 2022-05-28 DIAGNOSIS — M545 Low back pain, unspecified: Secondary | ICD-10-CM | POA: Diagnosis not present

## 2022-05-28 DIAGNOSIS — R509 Fever, unspecified: Secondary | ICD-10-CM | POA: Diagnosis present

## 2022-05-28 LAB — CBC WITH DIFFERENTIAL/PLATELET
Abs Immature Granulocytes: 0.09 10*3/uL — ABNORMAL HIGH (ref 0.00–0.07)
Basophils Absolute: 0.1 10*3/uL (ref 0.0–0.1)
Basophils Relative: 1 %
Eosinophils Absolute: 0.3 10*3/uL (ref 0.0–0.5)
Eosinophils Relative: 3 %
HCT: 43 % (ref 36.0–46.0)
Hemoglobin: 14.2 g/dL (ref 12.0–15.0)
Immature Granulocytes: 1 %
Lymphocytes Relative: 8 %
Lymphs Abs: 0.7 10*3/uL (ref 0.7–4.0)
MCH: 29.3 pg (ref 26.0–34.0)
MCHC: 33 g/dL (ref 30.0–36.0)
MCV: 88.7 fL (ref 80.0–100.0)
Monocytes Absolute: 0.8 10*3/uL (ref 0.1–1.0)
Monocytes Relative: 9 %
Neutro Abs: 7.2 10*3/uL (ref 1.7–7.7)
Neutrophils Relative %: 78 %
Platelets: 311 10*3/uL (ref 150–400)
RBC: 4.85 MIL/uL (ref 3.87–5.11)
RDW: 12.7 % (ref 11.5–15.5)
WBC: 9.1 10*3/uL (ref 4.0–10.5)
nRBC: 0 % (ref 0.0–0.2)

## 2022-05-28 LAB — URINALYSIS, ROUTINE W REFLEX MICROSCOPIC
Bacteria, UA: NONE SEEN
Bilirubin Urine: NEGATIVE
Glucose, UA: NEGATIVE mg/dL
Ketones, ur: NEGATIVE mg/dL
Leukocytes,Ua: NEGATIVE
Nitrite: NEGATIVE
Protein, ur: NEGATIVE mg/dL
Specific Gravity, Urine: 1.01 (ref 1.005–1.030)
pH: 7 (ref 5.0–8.0)

## 2022-05-28 LAB — RESP PANEL BY RT-PCR (RSV, FLU A&B, COVID)  RVPGX2
Influenza A by PCR: NEGATIVE
Influenza B by PCR: NEGATIVE
Resp Syncytial Virus by PCR: NEGATIVE
SARS Coronavirus 2 by RT PCR: POSITIVE — AB

## 2022-05-28 LAB — COMPREHENSIVE METABOLIC PANEL
ALT: 26 U/L (ref 0–44)
AST: 30 U/L (ref 15–41)
Albumin: 4.2 g/dL (ref 3.5–5.0)
Alkaline Phosphatase: 85 U/L (ref 38–126)
Anion gap: 10 (ref 5–15)
BUN: 9 mg/dL (ref 6–20)
CO2: 23 mmol/L (ref 22–32)
Calcium: 9.1 mg/dL (ref 8.9–10.3)
Chloride: 101 mmol/L (ref 98–111)
Creatinine, Ser: 0.94 mg/dL (ref 0.44–1.00)
GFR, Estimated: >60 mL/min (ref >60)
Glucose, Bld: 85 mg/dL (ref 70–99)
Potassium: 4.5 mmol/L (ref 3.5–5.1)
Sodium: 134 mmol/L — ABNORMAL LOW (ref 135–145)
Total Bilirubin: 0.8 mg/dL (ref 0.3–1.2)
Total Protein: 7.1 g/dL (ref 6.5–8.1)

## 2022-05-28 LAB — I-STAT BETA HCG BLOOD, ED (MC, WL, AP ONLY): I-stat hCG, quantitative: <5 m[IU]/mL (ref <5)

## 2022-05-28 LAB — LACTIC ACID, PLASMA: Lactic Acid, Venous: 1.6 mmol/L (ref 0.5–1.9)

## 2022-05-28 MED ORDER — LIDOCAINE 5 % EX PTCH
1.0000 | MEDICATED_PATCH | CUTANEOUS | Status: DC
  Administered 2022-05-28: 1 via TRANSDERMAL
  Filled 2022-05-28: qty 1

## 2022-05-28 MED ORDER — METHOCARBAMOL 500 MG PO TABS
500.0000 mg | ORAL_TABLET | Freq: Once | ORAL | Status: AC
  Administered 2022-05-28: 500 mg via ORAL
  Filled 2022-05-28: qty 1

## 2022-05-28 MED ORDER — METHOCARBAMOL 500 MG PO TABS: 500.0000 mg | ORAL_TABLET | Freq: Two times a day (BID) | ORAL | 0 refills | Status: DC

## 2022-05-28 MED ORDER — SODIUM CHLORIDE 0.9 % IV BOLUS
1000.0000 mL | Freq: Once | INTRAVENOUS | Status: AC
  Administered 2022-05-28: 1000 mL via INTRAVENOUS

## 2022-05-28 MED ORDER — ACETAMINOPHEN 325 MG PO TABS
650.0000 mg | ORAL_TABLET | Freq: Once | ORAL | Status: AC
  Administered 2022-05-28: 650 mg via ORAL
  Filled 2022-05-28: qty 2

## 2022-05-28 MED ORDER — DEXAMETHASONE SODIUM PHOSPHATE 10 MG/ML IJ SOLN
10.0000 mg | Freq: Once | INTRAMUSCULAR | Status: AC
  Administered 2022-05-28: 10 mg via INTRAVENOUS
  Filled 2022-05-28: qty 1

## 2022-05-28 MED ORDER — METHYLPREDNISOLONE 4 MG PO TBPK: ORAL_TABLET | ORAL | 0 refills | Status: DC

## 2022-05-28 MED ORDER — OXYCODONE-ACETAMINOPHEN 5-325 MG PO TABS
1.0000 | ORAL_TABLET | Freq: Once | ORAL | Status: AC
  Administered 2022-05-28: 1 via ORAL
  Filled 2022-05-28: qty 1

## 2022-05-28 MED ORDER — NAPROXEN 500 MG PO TABS: 500.0000 mg | ORAL_TABLET | Freq: Two times a day (BID) | ORAL | 0 refills | Status: DC

## 2022-05-28 NOTE — Discharge Instructions (Addendum)
It was a pleasure taking care of you today.  As discussed, your COVID test was positive which is likely causing your fever.  I am sending him a naproxen and a muscle relaxer for your low back pain.  Please follow-up with PCP within the next few days.  All of your labs were reassuring.  Return to the ER for new or worsening symptoms.  I am sending you home with steroids. Take as prescribed.

## 2022-05-28 NOTE — ED Triage Notes (Addendum)
Pt c/o lower back pain since this weekend worsening today. Endorses fever of 100.3 at home. Endorses headache and dizziness. 500mg  naproxen taken at 7am.

## 2022-05-28 NOTE — ED Provider Notes (Signed)
[facility] Provider Note   CSN: [id number] Arrival date & time: 05/28/22  1520     History  Chief Complaint  Patient presents with   Back Pain    Anna Vargas is a 32 y.o. female with a past medical history significant for anxiety, depression, vitamin D deficiency who presents to the ED due to low back pain x 5 days.  Patient evaluated by Dr. Posey Pronto with spine and scoliosis earlier today who recommended steroid injections.  Patient notes she developed a fever just prior to ED arrival which promoted her to present for further evaluation.. She had naproxen earlier today however, no other medications.  No recent low back injury.  Denies saddle anesthesia, bowel/bladder incontinence, lower extremity numbness/tingling, lower extremity weakness.  No history of IV drug use.  No history of back surgeries or cancer.  Patient notes she has had intermittent low back pain in the past; however, notes this pain is much worse than her baseline. She also notes she developed a headache and lightheadedness earlier today.  History obtained from patient and past medical records. No interpreter used during encounter.       Home Medications Prior to Admission medications   Medication Sig Start Date End Date Taking? Authorizing Provider  methocarbamol (ROBAXIN) 500 MG tablet Take 1 tablet (500 mg total) by mouth 2 (two) times daily. 05/28/22  Yes Suzy Bouchard, PA-C  methylPREDNISolone (MEDROL DOSEPAK) 4 MG TBPK tablet Use as directed 05/28/22  Yes Rasha Ibe C, PA-C  naproxen (NAPROSYN) 500 MG tablet Take 1 tablet (500 mg total) by mouth 2 (two) times daily. 05/28/22  Yes Jacky Dross, Druscilla Brownie, PA-C  ALPRAZolam Duanne Moron) 0.5 MG tablet Take 0.5 mg by mouth as needed. 10/10/20   [provider]  amphetamine-dextroamphetamine (ADDERALL XR) 20 MG 24 hr capsule Take by mouth every morning. 04/04/22   [provider]  buPROPion ER (WELLBUTRIN SR)  100 MG 12 hr tablet Take 100 mg by mouth every morning. 04/11/22   [provider]  citalopram (CELEXA) 40 MG tablet Take 40 mg by mouth at bedtime. 08/11/20   [provider]  cyclobenzaprine (FLEXERIL) 10 MG tablet Take 1 tablet (10 mg total) by mouth 3 (three) times daily as needed for muscle spasms. 05/27/22   Haydee Salter, MD  Dextromethorphan-buPROPion ER (AUVELITY) 45-105 MG TBCR Take 1 tablet by mouth in the morning and at bedtime.    [provider]  glycopyrrolate (ROBINUL) 2 MG tablet Take 2 tablets (4 mg total) by mouth daily. 04/10/22   Haydee Salter, MD  levonorgestrel (MIRENA) 20 MCG/DAY IUD by Intrauterine route. INSERTED 10/10/21 10/10/21   [provider]  naproxen (NAPROSYN) 500 MG tablet Take 1 tablet (500 mg total) by mouth 2 (two) times daily with a meal. 05/27/22   Haydee Salter, MD  VRAYLAR 3 MG capsule Take 3 mg by mouth at bedtime. 09/06/21   [provider]      Allergies    Latex    Review of Systems   Review of Systems  Constitutional:  Positive for chills and fever.  Cardiovascular:  Negative for chest pain.  Gastrointestinal:  Negative for abdominal pain.  Genitourinary:  Negative for dysuria.  Musculoskeletal:  Positive for back pain.  Neurological:  Positive for light-headedness and headaches. Negative for weakness and numbness.  All other systems reviewed and are negative.   Physical Exam Updated Vital Signs BP 116/66 (BP Location: Right  Arm)   Pulse (!) 104   Temp 98.2 F (36.8 C) (Oral)   Resp 15   Ht 5\' 4"  (1.626 m)   Wt 108.9 kg   SpO2 96%   BMI 41.20 kg/m  Physical Exam Vitals and nursing note reviewed.  Constitutional:      General: She is not in acute distress.    Appearance: She is not ill-appearing.  HENT:     Head: Normocephalic.  Eyes:     Pupils: Pupils are equal, round, and reactive to light.  Cardiovascular:     Rate and Rhythm: Normal rate and regular rhythm.     Pulses: Normal  pulses.     Heart sounds: Normal heart sounds. No murmur heard.    No friction rub. No gallop.  Pulmonary:     Effort: Pulmonary effort is normal.     Breath sounds: Normal breath sounds.  Abdominal:     General: Abdomen is flat. There is no distension.     Palpations: Abdomen is soft.     Tenderness: There is no abdominal tenderness. There is no guarding or rebound.  Musculoskeletal:        General: Normal range of motion.     Cervical back: Neck supple.     Comments: Midline lumbar tenderness. No thoracic tenderness. Bilateral lower extremities neurovascularly intact.   Skin:    General: Skin is warm and dry.  Neurological:     General: No focal deficit present.     Mental Status: She is alert.  Psychiatric:        Mood and Affect: Mood normal.        Behavior: Behavior normal.     ED Results / Procedures / Treatments   Labs (all labs ordered are listed, but only abnormal results are displayed) Labs Reviewed  RESP PANEL BY RT-PCR (RSV, FLU A&B, COVID)  RVPGX2 - Abnormal; Notable for the following components:      Result Value   SARS Coronavirus 2 by RT PCR POSITIVE (*)    All other components within normal limits  CBC WITH DIFFERENTIAL/PLATELET - Abnormal; Notable for the following components:   Abs Immature Granulocytes 0.09 (*)    All other components within normal limits  COMPREHENSIVE METABOLIC PANEL - Abnormal; Notable for the following components:   Sodium 134 (*)    All other components within normal limits  URINALYSIS, ROUTINE W REFLEX MICROSCOPIC - Abnormal; Notable for the following components:   Hgb urine dipstick SMALL (*)    All other components within normal limits  LACTIC ACID, PLASMA  I-STAT BETA HCG BLOOD, ED ([facility], [facility], [facility] ONLY)    EKG EKG Interpretation  Date/Time:  Tuesday May 28 2022 19:56:06 EDT Ventricular Rate:  109 PR Interval:  134 QRS Duration: 93 QT Interval:  338 QTC Calculation: 456 R Axis:   74 Text Interpretation: Sinus  tachycardia RSR' in V1 or V2, right VCD or RVH No previous ECGs available Confirmed by Wandra Arthurs [phone]) on 05/28/2022 7:57:37 PM  Radiology DG Chest Portable 1 View  Result Date: 05/28/2022 CLINICAL DATA:  SOB EXAM: PORTABLE CHEST 1 VIEW COMPARISON:  Chest x-ray 02/10/2021 FINDINGS: The heart and mediastinal contours are within normal limits. No focal consolidation. No pulmonary edema. No pleural effusion. No pneumothorax. No acute osseous abnormality. IMPRESSION: No active disease. Electronically Signed   By: Iven Finn M.D.   On: 05/28/2022 21:03    Procedures Procedures    Medications Ordered in ED Medications  lidocaine (  LIDODERM) 5 % 1 patch (1 patch Transdermal Patch Applied 05/28/22 1825)  dexamethasone (DECADRON) injection 10 mg (has no administration in time range)  acetaminophen (TYLENOL) tablet 650 mg (650 mg Oral Given 05/28/22 1822)  sodium chloride 0.9 % bolus 1,000 mL (0 mLs Intravenous Stopped 05/28/22 2052)  oxyCODONE-acetaminophen (PERCOCET/ROXICET) 5-325 MG per tablet 1 tablet (1 tablet Oral Given 05/28/22 1822)  methocarbamol (ROBAXIN) tablet 500 mg (500 mg Oral Given 05/28/22 1821)  sodium chloride 0.9 % bolus 1,000 mL (0 mLs Intravenous Stopped 05/28/22 2052)    ED Course/ Medical Decision Making/ A&P Clinical Course as of 05/28/22 2129  Tue May 28, 2022  1737 SARS Coronavirus 2 by RT PCR(!): POSITIVE [CA]  1927 Reassessed patient at bedside.  Patient notes she feels feverish.  Heart rate at 115 on cardiac monitor. Will recheck temperature. Awaiting on UA. Will give another L IVFs [CA]  2035 Informed by RN that patient ambulated to the bathroom and was severely short of breath. CXR and EKG ordered. HR in lower 100s on cardiac monitor [CA]    Clinical Course User Index [CA] Suzy Bouchard, PA-C                             Medical Decision Making Amount and/or Complexity of Data Reviewed External Data Reviewed: notes.    Details: PCP note  yesterday Labs: ordered. Decision-making details documented in ED Course. Radiology: ordered and independent interpretation performed. Decision-making details documented in ED Course. ECG/medicine tests: ordered and independent interpretation performed. Decision-making details documented in ED Course.  Risk OTC drugs. Prescription drug management.   This patient presents to the ED for concern of low back pain, this involves an extensive number of treatment options, and is a complaint that carries with it a high risk of complications and morbidity.  The differential diagnosis includes cauda equina, central cord compression, dural abscess, osteomyelitis, pyelonephritis, viral process, etc  32 year old female presents to the ED due to low back pain x 5 days.  Patient then developed a fever earlier today.  Evaluated by Dr. Posey Pronto at spine and scoliosis specialist who recommended steroid injections.  History of intermittent low back pain however, notes this feels much worse than her typical.  No injury.  Denies saddle anesthesia, bowel/bladder cons, lower extremity numbness/tingling, lower extremity weakness.  No urinary symptoms.  Denies abdominal pain.  Patient also developed a fever and lightheadedness earlier today.  No cough or sore throat.  Upon arrival, patient borderline febrile at 99 F and tachycardic at 123.  Patient nontoxic-appearing.  Midline lumbar tenderness.  Bilateral lower extremities neurovascularly intact.  Low suspicion for cauda equina or central cord compression. Labs, RVP, and UA ordered to rule out evidence of infection. If unremarkable with no etiology of fever, patient will need MRI lumbar spine.   COVID-positive.  Suspect fever related to COVID-19 infection.  Lower suspicion for epidural abscess, osteomyelitis, discitis, or other infectious etiologies of the low back.  CBC reassuring.  No leukocytosis.  Normal hemoglobin.  CMP significant for hyponatremia 134.  Normal renal  function.  Lactic acid normal.  Low suspicion for sepsis.  Pregnancy test negative.  Doubt ectopic pregnancy.  UA with small hematuria however, no evidence of infection.  Back pain is midline in the lumbar region.  Lower suspicion for kidney stone.  On reassessment, patient admits to significant improvement in low back pain after pain medication.  Patient able to ambulate in the ED  however, patient became extremely short of breath.  Chest x-ray and EKG ordered.  Shortness of breath likely due to COVID infection.  No wheeze on exam. CXR negative for evidence of PNA.   9:19 PM prior to discharge patient admits to new onset of numbness/tingling to right foot.  No weakness.  Sensation grossly intact on exam.  5/5 strength of right lower extremity. she also admits to erythema to bilateral upper extremities.  She notes she has a latex allergy however, no other known allergies.  Reaction did not occur after giving any medications.  Rash does not appear like urticaria.  Denies pruritus. Possible viral rash.  Lungs clear to auscultation bilaterally.  No wheeze or stridor.  Low suspicion for anaphylaxis.   Discussed with Dr. Darl Householder who evaluated patient at believes rash is viral in nature. Lower extremities neurologically intact. Will hold off on MRI at this time given my suspicion for cauda equina, central cord compression or infectious etiology is low.  Suspect fever is related to COVID-19. Discussed with Dr. Darl Householder who agrees with assessment and plan. Patient stable for discharge. Strict ED precautions discussed with patient. Patient states understanding and agrees to plan. Patient discharged home in no acute distress and stable vitals   Has PCP Hx anxiety       Final Clinical Impression(s) / ED Diagnoses Final diagnoses:  COVID-19 virus infection  Acute midline low back pain without sciatica    Rx / DC Orders ED Discharge Orders          Ordered    methocarbamol (ROBAXIN) 500 MG tablet  2 times daily         05/28/22 1945    naproxen (NAPROSYN) 500 MG tablet  2 times daily        05/28/22 1945    methylPREDNISolone (MEDROL DOSEPAK) 4 MG TBPK tablet        05/28/22 2126              Karie Kirks 05/28/22 2131    Drenda Freeze, MD 05/28/22 [phone]

## 2022-05-29 ENCOUNTER — Telehealth: Payer: Self-pay

## 2022-05-29 NOTE — Transitions of Care (Post Inpatient/ED Visit) (Signed)
   05/29/2022  Name: Anna Vargas MRN: [id number] DOB: January 02, 1991  Today's TOC FU Call Status: Today's TOC FU Call Status:: Successful TOC FU Call Competed TOC FU Call Complete Date: 05/29/22  Transition Care Management Follow-up Telephone Call Date of Discharge: 05/28/22 Discharge Facility[facility]) Type of Discharge: Emergency Department Reason for ED Visit: Other: (back pain) How have you been since you were released from the hospital?: Same Any questions or concerns?: No  Items Reviewed: Did you receive and understand the discharge instructions provided?: Yes Medications obtained and verified?: Yes (Medications Reviewed) Any new allergies since your discharge?: No Dietary orders reviewed?: NA Do you have support at home?: Yes  Home Care and Equipment/Supplies: [place] Ordered?: NA Any new equipment or medical supplies ordered?: NA  Functional Questionnaire: Do you need assistance with bathing/showering or dressing?: No Do you need assistance with meal preparation?: No Do you need assistance with eating?: No Do you have difficulty maintaining continence: No Do you need assistance with getting out of bed/getting out of a chair/moving?: No Do you have difficulty managing or taking your medications?: No  Follow up appointments reviewed: PCP Follow-up appointment confirmed?: No MD Provider Line Number:[phone] Given: No Do you need transportation to your follow-up appointment?: No Do you understand care options if your condition(s) worsen?: Yes-patient verbalized understanding    SIGNATURE Angeline Slim, BSN, RN

## 2022-09-16 ENCOUNTER — Ambulatory Visit
Admission: EM | Admit: 2022-09-16 | Discharge: 2022-09-16 | Disposition: A | Discharge status: Home / Self Care | Payer: Managed Care, Other (non HMO) | Attending: Urgent Care | Admitting: Urgent Care

## 2022-09-16 DIAGNOSIS — S0502XA Injury of conjunctiva and corneal abrasion without foreign body, left eye, initial encounter: Secondary | ICD-10-CM

## 2022-09-16 MED ORDER — TOBRAMYCIN 0.3 % OP OINT: 1.0000 | TOPICAL_OINTMENT | Freq: Three times a day (TID) | OPHTHALMIC | 0 refills | Status: AC

## 2022-09-16 NOTE — ED Provider Notes (Signed)
[facility]    CSN: [id number] Arrival date & time: 09/16/22  0825      History   Chief Complaint Chief Complaint  Patient presents with   Eye Drainage    HPI Anna Vargas is a 32 y.o. female.   Pleasant 32 year old female presents today due to concerns of left eye swelling and discomfort.  She states that yesterday she had some irritation throughout the day, that seemed to go away by evening.  She woke up this morning however and is complaining that her upper and lower left eyelid feels swollen, that she had green discharge from her eye, and she is having photophobia in her left eye.  She denies any URI symptoms including sneezing, cough, sinus pain, sore throat, ear pressure, fever.  She states apart from her left eye, she otherwise feels normal.  She does typically wear contacts but denies recently leaving them in.  She did try some over-the-counter dry eye relief which did not help her symptoms.  She denies any change in vision.  She denies any pressure behind her eye or redness of the skin. She denies any redness of the eyeball itself.      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 Authorizing Provider  tobramycin (TOBREX) 0.3 % ophthalmic ointment Place 1 Application into the left eye 3 (three) times daily for 5 days. 09/16/22 09/21/22 Yes Amarria Andreasen L, PA  ALPRAZolam (XANAX) 0.5 MG tablet Take 0.5 mg by mouth as needed. 10/10/20   [provider]  amphetamine-dextroamphetamine (ADDERALL XR) 20 MG 24 hr capsule Take by mouth every morning. 04/04/22   [provider]  buPROPion ER (WELLBUTRIN SR) 100 MG 12 hr tablet Take 100 mg by mouth every morning. 04/11/22   [provider]  citalopram (CELEXA) 40 MG tablet Take 40 mg by mouth at bedtime. 08/11/20   [provider]  cyclobenzaprine (FLEXERIL) 10 MG tablet Take 1 tablet (10 mg total) by mouth 3 (three) times daily as needed for muscle spasms. 05/27/22   Loyola Mast, MD  Dextromethorphan-buPROPion ER (AUVELITY) 45-105 MG TBCR Take 1 tablet by mouth in the morning and at bedtime.    [provider]  glycopyrrolate (ROBINUL) 2 MG tablet Take 2 tablets (4 mg total) by mouth daily. 04/10/22   Loyola Mast, MD  levonorgestrel (MIRENA) 20 MCG/DAY IUD by Intrauterine route. INSERTED 10/10/21 10/10/21   [provider]  methocarbamol (ROBAXIN) 500 MG tablet Take 1 tablet (500 mg total) by mouth 2 (two) times daily. 05/28/22   Mannie Stabile, PA-C  methylPREDNISolone (MEDROL DOSEPAK) 4 MG TBPK tablet Use as directed 05/28/22   Mannie Stabile, PA-C  naproxen (NAPROSYN) 500 MG tablet Take 1 tablet (500 mg total) by mouth 2 (two) times daily with a meal. 05/27/22   Loyola Mast, MD  naproxen (NAPROSYN) 500 MG tablet Take 1 tablet (500 mg total) by mouth 2 (two) times daily. 05/28/22   Aberman, Caroline C, PA-C  VRAYLAR 3 MG capsule Take 3 mg by mouth at bedtime. 09/06/21   [provider]    Family History Family History  Problem Relation Age of Onset   Hypertension Mother    Hyperlipidemia Father     Hypertension Father    Breast cancer Maternal Aunt 28 - 49   Lung cancer Maternal Grandfather    Heart disease Paternal Grandfather     Social History Social History   Tobacco Use   Smoking status: Never   Smokeless tobacco: Never  Vaping Use   Vaping status: Never Used  Substance Use Topics   Alcohol use: Yes    Alcohol/week: 2.0 standard drinks of alcohol    Types: 2 Standard drinks or equivalent per week    Comment: socially   Drug use: No     Allergies   Latex   Review of Systems Review of Systems  Eyes:  Positive for pain, discharge and itching.  All other systems reviewed and are negative.    Physical Exam Triage Vital Signs ED Triage Vitals  Encounter Vitals Group     BP 09/16/22 0844 135/84     Systolic BP Percentile --      Diastolic BP Percentile --      Pulse Rate 09/16/22 0844 (!) 106     Resp 09/16/22 0844 20     Temp 09/16/22 0844 99.1 F (37.3 C)     Temp Source 09/16/22 0844 Oral     SpO2 09/16/22 0844 97 %     Weight --      Height --      Head Circumference --      Peak Flow --      Pain Score 09/16/22 0843 2     Pain Loc --      Pain Education --      Exclude from Growth Chart --    No data found.  Updated Vital Signs BP 135/84 (BP Location: Right Arm)   Pulse (!) 106   Temp 99.1 F (37.3 C) (Oral)   Resp 20   SpO2 97%   Visual Acuity Right Eye Distance: 20/20 (With correction) Left Eye Distance: 20/20 (With correction) Bilateral Distance: 20/20 (With correction)  Right Eye Near:   Left Eye Near:    Bilateral Near:     Physical Exam Vitals and nursing note reviewed.  Constitutional:      General: She is not in acute distress.    Appearance: Normal appearance. She is not ill-appearing, toxic-appearing or diaphoretic.  HENT:     Head: Normocephalic and atraumatic.     Right Ear: Tympanic membrane, ear canal and external ear normal. There is no impacted cerumen.     Left Ear: Tympanic membrane, ear canal and external  ear normal. There is no impacted cerumen.     Nose:  Nose normal. No congestion or rhinorrhea.     Mouth/Throat:     Mouth: Mucous membranes are moist.     Pharynx: Oropharynx is clear. No oropharyngeal exudate.  Eyes:     General: Lids are normal. Lids are everted, no foreign bodies appreciated. Vision grossly intact. Gaze aligned appropriately. No allergic shiner, visual field deficit or scleral icterus.       Right eye: No foreign body, discharge or hordeolum.        Left eye: No foreign body, discharge or hordeolum.     Extraocular Movements: Extraocular movements intact.     Right eye: Normal extraocular motion and no nystagmus.     Left eye: Normal extraocular motion and no nystagmus.     Conjunctiva/sclera: Conjunctivae normal.     Right eye: Right conjunctiva is not injected. No chemosis, exudate or hemorrhage.    Left eye: Left conjunctiva is not injected. No chemosis, exudate or hemorrhage.    Pupils: Pupils are equal, round, and reactive to light. Pupils are equal.     Right eye: Pupil is round, reactive and not sluggish.     Left eye: Pupil is round, reactive and not sluggish. Corneal abrasion and fluorescein uptake present.     Visual Fields: Right eye visual fields normal and left eye visual fields normal.   Cardiovascular:     Rate and Rhythm: Tachycardia present.  Pulmonary:     Effort: Pulmonary effort is normal. No respiratory distress.  Musculoskeletal:     Cervical back: Normal range of motion.  Lymphadenopathy:     Cervical: No cervical adenopathy.  Neurological:     Mental Status: She is alert.      UC Treatments / Results  Labs (all labs ordered are listed, but only abnormal results are displayed) Labs Reviewed - No data to display  EKG   Radiology No results found.  Procedures Procedures (including critical care time)  Medications Ordered in UC Medications - No data to display  Initial Impression / Assessment and Plan / UC Course  I have  reviewed the triage vital signs and the nursing notes.  Pertinent labs & imaging results that were available during my care of the patient were reviewed by me and considered in my medical decision making (see chart for details).     Corneal abrasion L eye -small corneal abrasion noted around 3:00 on the left eye.  I instructed patient to take over-the-counter pain medication such as Tylenol or ibuprofen.  She did has significant relief to the pain after administration of 1 drop tetracaine.  Instructed patient not to use her contacts for a minimum of 5 days.  Will discharge her home with tobramycin ophthalmic ointment 3 times daily for the next 5 days.  Patient also instructed to follow-up with ophthalmology should any new or worsening symptoms occur.  Out of work today.   Final Clinical Impressions(s) / UC Diagnoses   Final diagnoses:  Abrasion of left cornea, initial encounter     Discharge Instructions      You have a corneal abrasion, which is a scratch. Please read the attached handout.  Do not use your contact lenses for at least 5 days.  Please avoid touching your eye; if you do, [facility] YOUR HANDS!  I have prescribed tobramycin eye ointment to PREVENT secondary bacterial infection. Use this on both the left eye 3 times daily x 5 days.  If the ointment is too hard to administer, hold it in your warm hands  for 5 minutes prior and it will act more like a drop.  Warm moist washcloths with Laural Benes and Johnson baby shampoo can be used to gently massage and cleans to eyes.  Return to clinic or follow up with an eye specialist if any fever, eye pain, change in vision.      ED Prescriptions     Medication Sig Dispense Auth. Provider   tobramycin (TOBREX) 0.3 % ophthalmic ointment Place 1 Application into the left eye 3 (three) times daily for 5 days. 3.5 g Durant Scibilia L, [place]      PDMP not reviewed this encounter.   Maretta Bees, [place] 09/16/22 [phone]

## 2022-09-16 NOTE — Discharge Instructions (Addendum)
You have a corneal abrasion, which is a scratch. Please read the attached handout.  Do not use your contact lenses for at least 5 days.  Please avoid touching your eye; if you do, [facility] YOUR HANDS!  I have prescribed tobramycin eye ointment to PREVENT secondary bacterial infection. Use this on both the left eye 3 times daily x 5 days.  If the ointment is too hard to administer, hold it in your warm hands for 5 minutes prior and it will act more like a drop.  Warm moist washcloths with Laural Benes and Johnson baby shampoo can be used to gently massage and cleans to eyes.  Return to clinic or follow up with an eye specialist if any fever, eye pain, change in vision.

## 2022-09-16 NOTE — ED Triage Notes (Signed)
Pt c/o left eye drainage, redness, irritation, burning and swelling-started yesterday-NAD-steady gait

## 2022-09-17 ENCOUNTER — Telehealth: Payer: Self-pay

## 2022-09-17 MED ORDER — POLYMYXIN B-TRIMETHOPRIM 10000-0.1 UNIT/ML-% OP SOLN: 1.0000 [drp] | Freq: Four times a day (QID) | OPHTHALMIC | 0 refills | Status: DC

## 2022-11-19 ENCOUNTER — Telehealth (INDEPENDENT_AMBULATORY_CARE_PROVIDER_SITE_OTHER): Payer: 59 | Admitting: Family Medicine

## 2022-11-19 ENCOUNTER — Encounter: Payer: Self-pay | Admitting: Family Medicine

## 2022-11-19 VITALS — Ht 64.0 in | Wt 240.0 lb

## 2022-11-19 DIAGNOSIS — Z9189 Other specified personal risk factors, not elsewhere classified: Secondary | ICD-10-CM | POA: Diagnosis not present

## 2022-11-19 DIAGNOSIS — R400 Somnolence: Secondary | ICD-10-CM | POA: Insufficient documentation

## 2022-11-19 NOTE — Assessment & Plan Note (Signed)
As this was a video visit, I could not measure neck circumference. However, other symptoms are suggestive of OSA. I will refer her to neurology to discuss options for a sleep study. her insurance may require an at-home study as an initial step.

## 2022-11-19 NOTE — Progress Notes (Signed)
[facility] [address] [place] [address] Dept: [phone] Dept Fax: [phone]  Virtual Video Visit  I connected with Mardelle Matte Frei[place]uth on 11/19/22 at  4:00 PM EDT by a video enabled telemedicine application and verified that I am speaking with the correct person using two identifiers.  Location patient: Work Location provider: Clinic Persons participating in the virtual visit: Patient, Provider  I discussed the limitations of evaluation and management by telemedicine and the availability of in person appointments. The patient expressed understanding and agreed to proceed.  Chief Complaint  Patient presents with   Fatigue    C/o having daytime fatigue, snoring , HA's x couple years.  ? Needs sleep study.     SUBJECTIVE:  HPI: Anna Vargas is a 32 y.o. female who presents with concerns about daytime hypersomnolence. She notes this has been an issue for 5+ years. She was advised by her psychiatrist several times to consider a sleep study. She admits to snoring, though has no history of observed apneic spells. She does have a history of obesity. She also has nonrestorative sleep. Her sister has sleep apnea.   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   Arthritis of left knee 04/03/2021   Past Surgical History:  Procedure Laterality Date   ANKLE SURGERY Left    2021   KNEE SURGERY  03/04/2010   Family History  Problem Relation Age of Onset   Hypertension Mother    Hyperlipidemia Father    Hypertension Father    Breast cancer Maternal Aunt 60 - 49   Lung cancer Maternal  Grandfather    Heart disease Paternal Grandfather    Social History   Tobacco Use   Smoking status: Never   Smokeless tobacco: Never  Vaping Use   Vaping status: Never Used  Substance Use Topics   Alcohol use: Yes    Alcohol/week: 2.0 standard drinks of alcohol    Types: 2 Standard drinks or equivalent per week    Comment: socially   Drug use: No    Current Outpatient Medications:    ALPRAZolam (XANAX) 0.5 MG tablet, Take 0.5 mg by mouth as needed., Disp: , Rfl:    amphetamine-dextroamphetamine (ADDERALL XR) 20 MG 24 hr capsule, Take by mouth every morning., Disp: , Rfl:    buPROPion ER (WELLBUTRIN SR) 100 MG 12 hr tablet, Take 100 mg by mouth every morning., Disp: , Rfl:    citalopram (CELEXA) 40 MG tablet, Take 40 mg by mouth at bedtime., Disp: , Rfl:    Dextromethorphan-buPROPion ER (AUVELITY) 45-105 MG TBCR, Take 1 tablet by mouth in the morning and at bedtime., Disp: , Rfl:    glycopyrrolate (ROBINUL) 2 MG tablet, Take 2 tablets (4 mg total) by mouth daily., Disp: 180 tablet, Rfl: 3   levonorgestrel (MIRENA) 20 MCG/DAY IUD, by Intrauterine route. INSERTED 10/10/21, Disp: , Rfl:    VRAYLAR 3 MG capsule, Take 3 mg by mouth at bedtime., Disp: , Rfl:  Allergies  Allergen Reactions   Latex Anaphylaxis   ROS: See pertinent positives and negatives per HPI.  OBSERVATIONS/OBJECTIVE:  VITALS per patient if applicable: Today's Vitals  11/19/22 1517  Weight: 240 lb (108.9 kg)  Height: 5\' 4"  (1.626 m)   Body mass index is 41.2 kg/m.   GENERAL: Alert and oriented. Appears well and in no acute distress. HEENT: Atraumatic. Eyes clear. No obvious abnormalities on inspection of external nose and ears. NECK: Normal movements of the head and neck. LUNGS: On inspection, no signs of respiratory distress. Breathing rate appears normal. No obvious gross SOB, gasping or wheezing, and no conversational dyspnea. CV: No obvious cyanosis. MS: Moves all visible extremities without noticeable  abnormality. PSYCH/NEURO: Pleasant and cooperative. No obvious depression or anxiety. Speech and thought processing grossly intact.  STOP-Bang Score for Sleep Apnea Screening  Patient Self-Reported Questions         Score Do you snore loudly? (louder than  1  talking or sufficiently loud to be  heard through doors)  Do you often feel tired, fatigued, or  1  sleepy during the daytime?  Has anyone observed you stop breathing 0  during sleep?  Do you have (or are you being treated for) 0  high blood pressure?  Clinical Information          Score BMI>35 kg/m2     1  Age > 50 years    0  Neck circumference > 40 cm   ?  Gender (female)     0  Total      3  A score <3 indicates a low risk of sleep apnea.  ASSESSMENT AND PLAN:  Problem List Items Addressed This Visit       Other   At risk for sleep apnea - Primary    As this was a video visit, I could not measure neck circumference. However, other symptoms are suggestive of OSA. I will refer her to neurology to discuss options for a sleep study. her insurance may require an at-home study as an initial step.      Relevant Orders   Ambulatory referral to Sleep Studies   Daytime somnolence   Relevant Orders   Ambulatory referral to Sleep Studies     I discussed the assessment and treatment plan with the patient. The patient was provided an opportunity to ask questions and all were answered. The patient agreed with the plan and demonstrated an understanding of the instructions.   The patient was advised to call back or seek an in-person evaluation if the symptoms worsen or if the condition fails to improve as anticipated.  Return if symptoms worsen or fail to improve.   Loyola Mast, MD

## 2022-11-21 ENCOUNTER — Ambulatory Visit: Payer: 59 | Admitting: Nurse Practitioner

## 2022-11-21 ENCOUNTER — Encounter: Payer: Self-pay | Admitting: Nurse Practitioner

## 2022-11-21 VITALS — BP 114/78 | HR 111 | Temp 97.5°F | Ht 64.0 in | Wt 247.8 lb

## 2022-11-21 DIAGNOSIS — G43009 Migraine without aura, not intractable, without status migrainosus: Secondary | ICD-10-CM | POA: Diagnosis not present

## 2022-11-21 MED ORDER — KETOROLAC TROMETHAMINE 30 MG/ML IJ SOLN: 30.0000 mg | Freq: Once | INTRAMUSCULAR | Status: DC

## 2022-11-21 MED ORDER — PROMETHAZINE HCL 25 MG PO TABS: 25.0000 mg | ORAL_TABLET | Freq: Three times a day (TID) | ORAL | 0 refills | Status: DC | PRN

## 2022-11-21 MED ORDER — KETOROLAC TROMETHAMINE 60 MG/2ML IM SOLN
60.0000 mg | Freq: Once | INTRAMUSCULAR | Status: AC
  Administered 2022-11-21: 60 mg via INTRAMUSCULAR

## 2022-11-21 MED ORDER — RIZATRIPTAN BENZOATE 5 MG PO TABS: 5.0000 mg | ORAL_TABLET | ORAL | 0 refills | Status: DC | PRN

## 2022-11-21 NOTE — Patient Instructions (Signed)
Call office if no improvement in 2days.  Migraine Headache A migraine headache is a very strong throbbing pain on one or both sides of your head. This type of headache can also cause other symptoms. It can last from 4 hours to 3 days. Talk with your doctor about what things may bring on (trigger) this condition. What are the causes? The exact cause of a migraine is not known. This condition may be brought on or caused by: Smoking. Medicines, such as: Medicine used to treat chest pain (nitroglycerin). Birth control pills. Estrogen. Some blood pressure medicines. Certain substances in some foods or drinks. Foods and drinks, such as: Cheese. Chocolate. Alcohol. Caffeine. Doing physical activity that is very hard. Other things that may trigger a migraine headache include: Periods. Pregnancy. Hunger. Stress. Getting too much or too little sleep. Weather changes. Feeling tired (fatigue). What increases the risk? Being 78-3 years old. Being female. Having a family history of migraine headaches. Being Caucasian. Having a mental health condition, such as being sad (depressed) or feeling worried or nervous (anxious). Being very overweight (obese). What are the signs or symptoms? A throbbing pain. This pain may: Happen in any area of the head, such as on one or both sides. Make it hard to do daily activities. Get worse with physical activity. Get worse around bright lights, loud noises, or smells. Other symptoms may include: Feeling like you may vomit (nauseous). Vomiting. Dizziness. Before a migraine headache starts, you may get warning signs (an aura). An aura may include: Seeing flashing lights or having blind spots. Seeing bright spots, halos, or zigzag lines. Having tunnel vision or blurred vision. Having numbness or a tingling feeling. Having trouble talking. Having weak muscles. After a migraine ends, you may have symptoms. These may include: Tiredness. Trouble  thinking (concentrating). How is this treated? Taking medicines that: Relieve pain. Relieve the feeling like you may vomit. Prevent migraine headaches. Treatment may also include: Acupuncture. Lifestyle changes like avoiding foods that bring on migraine headaches. Learning ways to control your body functions (biofeedback). Therapy to help you know and deal with negative thoughts (cognitive behavioral therapy). Follow these instructions at home: Medicines Take over-the-counter and prescription medicines only as told by your doctor. If told, take steps to prevent problems with pooping (constipation). You may need to: Drink enough fluid to keep your pee (urine) pale yellow. Take medicines. You will be told what medicines to take. Eat foods that are high in fiber. These include beans, whole grains, and fresh fruits and vegetables. Limit foods that are high in fat and sugar. These include fried or sweet foods. Ask your doctor if you should avoid driving or using machines while you are taking your medicine. Lifestyle  Do not drink alcohol. Do not smoke or use any products that contain nicotine or tobacco. If you need help quitting, ask your doctor. Get 7-9 hours of sleep each night, or the amount recommended by your doctor. Find ways to deal with stress, such as meditation, deep breathing, or yoga. Try to exercise often. This can help lessen how bad and how often your migraines happen. General instructions Keep a journal to find out what may bring on your migraine headaches. This can help you avoid those things. For example, write down: What you eat and drink. How much sleep you get. Any change to your medicines or diet. If you have a migraine headache: Avoid things that make your symptoms worse, such as bright lights. Lie down in a dark, quiet room.  Do not drive or use machinery. Ask your doctor what activities are safe for you. Where to find more information Coalition for Headache  and Migraine Patients (CHAMP): headachemigraine.org American Migraine Foundation: americanmigrainefoundation.org National Headache Foundation: headaches.org Contact a doctor if: You get a migraine headache that is different or worse than others you have had. You have more than 15 days of headaches in one month. Get help right away if: Your migraine headache gets very bad. Your migraine headache lasts more than 72 hours. You have a fever or stiff neck. You have trouble seeing. Your muscles feel weak or like you cannot control them. You lose your balance a lot. You have trouble walking. You faint. You have a seizure. This information is not intended to replace advice given to you by your health care provider. Make sure you discuss any questions you have with your health care provider. Document Revised: 10/15/2021 Document Reviewed: 10/15/2021 Elsevier Patient Education  2024 [employer].

## 2022-11-21 NOTE — Progress Notes (Signed)
Acute Office Visit  Subjective:    Patient ID: Anna Vargas, female    DOB: Jul 02, 1990, 32 y.o.   MRN: [id number]  Chief Complaint  Patient presents with   Migraine    Ongoing for 4 days, nausea and vomiting started this morning   Reports she was accompanied by her father today. He is in the waiting room.  Migraine without aura and without status migrainosus, not intractable Onset at age 67. Describes as global arching. No aura,  Associated with nausea, photosensitivity, and blurry vision Occurs once a month and typically resolves with use of Excedrin. Current symptoms have not improved with excedrin. Onset 4days ago. Unknown trigger. Last eye exam: 80month ago, normal per patient Denies any recent head injury Hx of concussion without LOC 46yrs ago. Had Head CT by [place] provider (I do not have access to imaging report). No Hx of seizures. She has pending referral to neurology to eval for possible OSA Fhx of migraines-mother No change in current med doses. IUD in place  Normal neuro exam today. Administered toradol 60mg  IM today Sent maxalt and promethazine Provided ED precautions and advised to call office if no improvement in 2days   Outpatient Medications Prior to Visit  Medication Sig   ALPRAZolam (XANAX) 0.5 MG tablet Take 0.5 mg by mouth as needed.   amphetamine-dextroamphetamine (ADDERALL XR) 20 MG 24 hr capsule Take by mouth every morning.   buPROPion ER (WELLBUTRIN SR) 100 MG 12 hr tablet Take 100 mg by mouth every morning.   citalopram (CELEXA) 40 MG tablet Take 40 mg by mouth at bedtime.   Dextromethorphan-buPROPion ER (AUVELITY) 45-105 MG TBCR Take 1 tablet by mouth in the morning and at bedtime.   glycopyrrolate (ROBINUL) 2 MG tablet Take 2 tablets (4 mg total) by mouth daily.   levonorgestrel (MIRENA) 20 MCG/DAY IUD by Intrauterine route. INSERTED 10/10/21   VRAYLAR 3 MG capsule Take 3 mg by mouth at bedtime.   No facility-administered medications  prior to visit.    Reviewed past medical and social history.  Review of Systems Per HPI     Objective:    Physical Exam Vitals reviewed.  Eyes:     Extraocular Movements: Extraocular movements intact.     Conjunctiva/sclera: Conjunctivae normal.  Cardiovascular:     Rate and Rhythm: Normal rate.     Pulses: Normal pulses.  Pulmonary:     Effort: Pulmonary effort is normal.  Musculoskeletal:     Cervical back: Normal range of motion and neck supple.  Neurological:     Mental Status: She is alert and oriented to person, place, and time.     Cranial Nerves: No cranial nerve deficit.     Gait: Gait normal.  Psychiatric:        Mood and Affect: Mood normal.        Behavior: Behavior normal.        Thought Content: Thought content normal.    BP 114/78 (BP Location: Left Arm)   Pulse (!) 111   Temp (!) 97.5 F (36.4 C)   Ht 5\' 4"  (1.626 m)   Wt 247 lb 12.8 oz (112.4 kg)   SpO2 99%   BMI 42.53 kg/m  BP Readings from Last 3 Encounters:  11/21/22 114/78  09/16/22 135/84  05/28/22 (!) 110/49   No results found for any visits on 11/21/22.     Assessment & Plan:   Problem List Items Addressed This Visit     Migraine without aura  and without status migrainosus, not intractable - Primary    Onset at age 39. Describes as global arching. No aura,  Associated with nausea, photosensitivity, and blurry vision Occurs once a month and typically resolves with use of Excedrin. Current symptoms have not improved with excedrin. Onset 4days ago. Unknown trigger. Last eye exam: 9month ago, normal per patient Denies any recent head injury Hx of concussion without LOC 50yrs ago. Had Head CT by [place] provider (I do not have access to imaging report). No Hx of seizures. She has pending referral to neurology to eval for possible OSA Fhx of migraines-mother No change in current med doses. IUD in place  Normal neuro exam today. Administered toradol 60mg  IM today Sent maxalt and  promethazine Provided ED precautions and advised to call office if no improvement in 2days      Relevant Medications   rizatriptan (MAXALT) 5 MG tablet   promethazine (PHENERGAN) 25 MG tablet     Meds ordered this encounter  Medications   DISCONTD: ketorolac (TORADOL) 30 MG/ML injection 30 mg   rizatriptan (MAXALT) 5 MG tablet    Sig: Take 1-2 tablets (5-10 mg total) by mouth as needed for migraine. May repeat in 2 hours if needed. No more than 30mg  in 24hrs    Dispense:  10 tablet    Refill:  0    Order Specific Question:   Supervising Provider    Answer:   Nadene Rubins ALFRED [[id number]]   promethazine (PHENERGAN) 25 MG tablet    Sig: Take 1 tablet (25 mg total) by mouth every 8 (eight) hours as needed for nausea or vomiting.    Dispense:  21 tablet    Refill:  0    Order Specific Question:   Supervising Provider    Answer:   Nadene Rubins ALFRED [[id number]]   ketorolac (TORADOL) injection 60 mg   Return if symptoms worsen or fail to improve.    Alysia Penna, NP

## 2022-11-21 NOTE — Assessment & Plan Note (Signed)
Onset at age 32. Describes as global arching. No aura,  Associated with nausea, photosensitivity, and blurry vision Occurs once a month and typically resolves with use of Excedrin. Current symptoms have not improved with excedrin. Onset 4days ago. Unknown trigger. Last eye exam: 42month ago, normal per patient Denies any recent head injury Hx of concussion without LOC 1yrs ago. Had Head CT by [place] provider (I do not have access to imaging report). No Hx of seizures. She has pending referral to neurology to eval for possible OSA Fhx of migraines-mother No change in current med doses. IUD in place  Normal neuro exam today. Administered toradol 60mg  IM today Sent maxalt and promethazine Provided ED precautions and advised to call office if no improvement in 2days

## 2023-02-15 ENCOUNTER — Other Ambulatory Visit: Payer: Self-pay | Admitting: Family Medicine

## 2023-02-15 DIAGNOSIS — R61 Generalized hyperhidrosis: Secondary | ICD-10-CM

## 2023-03-18 ENCOUNTER — Ambulatory Visit: Payer: 59 | Admitting: Radiology

## 2023-03-18 ENCOUNTER — Encounter: Payer: Self-pay | Admitting: Family Medicine

## 2023-03-18 VITALS — BP 108/72 | HR 117

## 2023-03-18 DIAGNOSIS — N649 Disorder of breast, unspecified: Secondary | ICD-10-CM | POA: Diagnosis not present

## 2023-03-18 DIAGNOSIS — R61 Generalized hyperhidrosis: Secondary | ICD-10-CM

## 2023-03-18 MED ORDER — NYSTATIN-TRIAMCINOLONE 100000-0.1 UNIT/GM-% EX OINT: 1.0000 | TOPICAL_OINTMENT | Freq: Two times a day (BID) | CUTANEOUS | 1 refills | Status: DC

## 2023-03-18 MED ORDER — GLYCOPYRROLATE 2 MG PO TABS: 4.0000 mg | ORAL_TABLET | Freq: Every day | ORAL | 3 refills | Status: AC

## 2023-03-18 NOTE — Progress Notes (Signed)
   Anna Vargas 11-01-1990 [id number]   History:  33 y.o. G0 presents with complaints of rash, itching and swelling left breast (rash began 2 weeks ago and rash spread to right breast 2 days ago).   Gynecologic History No LMP recorded. (Menstrual status: IUD).     Obstetric History OB History  Gravida Para Term Preterm AB Living  0 0 0 0 0 0  SAB IAB Ectopic Multiple Live Births  0 0 0 0 0     The following portions of the patient's history were reviewed and updated as appropriate: allergies, current medications, past family history, past medical history, past social history, past surgical history, and problem list.  Review of Systems Pertinent items noted in HPI and remainder of comprehensive ROS otherwise negative.   Past medical history, past surgical history, family history and social history were all reviewed and documented in the EPIC chart.   Exam:  Vitals:   03/18/23 0832  BP: 108/72  Pulse: (!) 117  SpO2: 96%   There is no height or weight on file to calculate BMI.  Physical Exam Vitals and nursing note reviewed.  Constitutional:      Appearance: Normal appearance. She is normal weight.  Pulmonary:     Effort: Pulmonary effort is normal.  Chest:  Breasts:    Right: Skin change present.     Left: Skin change present.     Comments: Bright red erythema present on bilateral breasts, worse under the folds of breast Neurological:     Mental Status: She is alert.  Psychiatric:        Mood and Affect: Mood normal.        Thought Content: Thought content normal.        Judgment: Judgment normal.      Assessment/Plan:   1. Breast disorder in female (Primary) Consistent with intertrigo - nystatin -triamcinolone  ointment (MYCOLOG); Apply 1 Application topically 2 (two) times daily.  Dispense: 30 g; Refill: 1 Call if no improvement in 1 week Wash bras in free and clear detergent Change out of sweaty clothes after exercising or sweating   Almon Whitford,  Pookela Sellin B WHNP-BC 8:45 AM 03/18/2023

## 2023-03-18 NOTE — Telephone Encounter (Signed)
 Refill request for  Robinul 2 mg LR  04/10/22, #180, 3 rf LOV  11/19/22 FOV   none scheduled.   Please review and advise.  Thanks.  Dm/cma

## 2023-03-26 ENCOUNTER — Ambulatory Visit: Payer: Self-pay | Admitting: Family Medicine

## 2023-03-26 ENCOUNTER — Ambulatory Visit: Payer: 59 | Admitting: Family Medicine

## 2023-03-26 VITALS — BP 124/78 | HR 74 | Temp 97.8°F | Wt 246.0 lb

## 2023-03-26 DIAGNOSIS — R21 Rash and other nonspecific skin eruption: Secondary | ICD-10-CM | POA: Diagnosis not present

## 2023-03-26 MED ORDER — TRIAMCINOLONE ACETONIDE 0.5 % EX OINT: 1.0000 | TOPICAL_OINTMENT | Freq: Two times a day (BID) | CUTANEOUS | 3 refills | Status: DC

## 2023-03-26 MED ORDER — HYDROXYZINE PAMOATE 25 MG PO CAPS: 25.0000 mg | ORAL_CAPSULE | Freq: Four times a day (QID) | ORAL | 0 refills | Status: DC | PRN

## 2023-03-26 NOTE — Assessment & Plan Note (Signed)
Pruritic papules.  Associated with headache and feeling tired.  Possibly irritant/contact dermatitis versus viral prodrome rash.  No signs of anaphylaxis or angioedema.  Plan: Trial hydroxyzine Trial topical triamcinolone Follow-up if no improvement ED and return precaution discussed

## 2023-03-26 NOTE — Progress Notes (Signed)
Assessment/Plan:   Problem List Items Addressed This Visit       Musculoskeletal and Integument   Rash - Primary   Pruritic papules.  Associated with headache and feeling tired.  Possibly irritant/contact dermatitis versus viral prodrome rash.  No signs of anaphylaxis or angioedema.  Plan: Trial hydroxyzine Trial topical triamcinolone Follow-up if no improvement ED and return precaution discussed      Relevant Medications   hydrOXYzine (VISTARIL) 25 MG capsule   triamcinolone ointment (KENALOG) 0.5 %    There are no discontinued medications.  No follow-ups on file.    Subjective:   Encounter date: 03/26/2023  Anna Vargas is a 33 y.o. female who has Anxiety; Morbid obesity with BMI of 40.0-44.9, adult (HCC); Pure hypercholesterolemia; Recurrent major depression in full remission (HCC); Vitamin D deficiency; Hyperhidrosis; Arthritis of left knee; Low energy; ETD (Eustachian tube dysfunction), bilateral; At risk for sleep apnea; Daytime somnolence; Migraine without aura and without status migrainosus, not intractable; and Rash on their problem list..   She  has a past medical history of Anxiety and Depression..   She presents with chief complaint of Urticaria (mild hives and itching x yesterday, tx with benadryl and cortisone cream. Now feels foggy ) .   Rash: Patient complains of rash involving the bilateral arm and chest and breast . Rash started 1 day ago. Appearance of rash at onset: Color of lesion(s): red, Size of lesion(s): 2-3 mm raised. Rash has changed over time Initial distribution: bilateral arm.  Discomfort associated with rash: is pruritic.  Associated symptoms:  l tired with less energy and headache . Denies: abdominal pain, arthralgia, congestion, cough, crankiness, decrease in appetite, decrease in energy level, fever, headache, irritability, myalgia, nausea, sore throat, vomiting, and trouble swallowing or breathing . Patient has not had previous evaluation  of rash. Patient has had previous treatment: Benadryl and topical hydrocortisone OTC.Marland Kitchen  Response to treatment: No improvement. Patient has not had contacts with similar rash. Patient has not identified precipitant. Patient has not had new exposures (soaps, lotions, laundry detergents, foods, medications, plants, insects or animals.)    Past Surgical History:  Procedure Laterality Date   ANKLE SURGERY Left    2021   KNEE SURGERY  03/04/2010    Outpatient Medications Prior to Visit  Medication Sig Dispense Refill   ALPRAZolam (XANAX) 0.5 MG tablet Take 0.5 mg by mouth as needed.     amphetamine-dextroamphetamine (ADDERALL XR) 20 MG 24 hr capsule Take by mouth every morning.     buPROPion ER (WELLBUTRIN SR) 100 MG 12 hr tablet Take 100 mg by mouth every morning.     citalopram (CELEXA) 40 MG tablet Take 40 mg by mouth at bedtime.     Dextromethorphan-buPROPion ER (AUVELITY) 45-105 MG TBCR Take 1 tablet by mouth in the morning and at bedtime.     glycopyrrolate (ROBINUL) 2 MG tablet Take 2 tablets (4 mg total) by mouth daily. 180 tablet 3   levonorgestrel (MIRENA) 20 MCG/DAY IUD by Intrauterine route. INSERTED 10/10/21     nystatin-triamcinolone ointment (MYCOLOG) Apply 1 Application topically 2 (two) times daily. 30 g 1   promethazine (PHENERGAN) 25 MG tablet Take 1 tablet (25 mg total) by mouth every 8 (eight) hours as needed for nausea or vomiting. 21 tablet 0   rizatriptan (MAXALT) 5 MG tablet Take 1-2 tablets (5-10 mg total) by mouth as needed for migraine. May repeat in 2 hours if needed. No more than 30mg  in 24hrs 10 tablet 0  VRAYLAR 3 MG capsule Take 3 mg by mouth at bedtime.     No facility-administered medications prior to visit.    Family History  Problem Relation Age of Onset   Hypertension Mother    Hyperlipidemia Father    Hypertension Father    Breast cancer Maternal Aunt 54 - 41   Lung cancer Maternal Grandfather    Heart disease Paternal Grandfather     Social  History   Socioeconomic History   Marital status: Divorced    Spouse name: Not on file   Number of children: 0   Years of education: Not on file   Highest education level: Bachelor's degree (e.g., BA, AB, BS)  Occupational History   Occupation: Scientist, water quality    Comment: Consulting civil engineer  Tobacco Use   Smoking status: Never   Smokeless tobacco: Never  Vaping Use   Vaping status: Never Used  Substance and Sexual Activity   Alcohol use: Yes    Alcohol/week: 2.0 standard drinks of alcohol    Types: 2 Standard drinks or equivalent per week    Comment: socially   Drug use: No   Sexual activity: Never    Partners: Male    Birth control/protection: I.U.D.    Comment: Mirena inserted 11/2015  Other Topics Concern   Not on file  Social History Narrative   2 years of pharmacy school (did not finish degree)   Social Drivers of Health   Financial Resource Strain: High Risk (03/26/2023)   Overall Financial Resource Strain (CARDIA)    Difficulty of Paying Living Expenses: Very hard  Food Insecurity: Food Insecurity Present (03/26/2023)   Hunger Vital Sign    Worried About Running Out of Food in the Last Year: Sometimes true    Ran Out of Food in the Last Year: Never true  Transportation Needs: No Transportation Needs (03/26/2023)   PRAPARE - Administrator, Civil Service (Medical): No    Lack of Transportation (Non-Medical): No  Physical Activity: Sufficiently Active (03/26/2023)   Exercise Vital Sign    Days of Exercise per Week: 5 days    Minutes of Exercise per Session: 30 min  Stress: No Stress Concern Present (03/26/2023)   [employer] of Occupational Health - Occupational Stress Questionnaire    Feeling of Stress : Not at all  Social Connections: Socially Isolated (03/26/2023)   Social Connection and Isolation Panel [NHANES]    Frequency of Communication with Friends and Family: More than three times a week    Frequency of Social Gatherings with Friends and  Family: Three times a week    Attends Religious Services: Never    Active Member of Clubs or Organizations: No    Attends Engineer, structural: Not on file    Marital Status: Divorced  Intimate Partner Violence: Not on file                                                                                                  Objective:  Physical Exam: BP 124/78   Pulse 74   Temp 97.8 F (36.6 C) (  Temporal)   Wt 246 lb (111.6 kg)   SpO2 99%   BMI 42.23 kg/m     Physical Exam Constitutional:      General: She is not in acute distress.    Appearance: Normal appearance. She is not ill-appearing or toxic-appearing.  HENT:     Head: Normocephalic and atraumatic.     Nose: Nose normal. No congestion.     Mouth/Throat:     Lips: No lesions.     Pharynx: No pharyngeal swelling or uvula swelling.     Tonsils: 0 on the right. 0 on the left.  Eyes:     General: No scleral icterus.    Extraocular Movements: Extraocular movements intact.  Cardiovascular:     Rate and Rhythm: Normal rate and regular rhythm.     Pulses: Normal pulses.     Heart sounds: Normal heart sounds.  Pulmonary:     Effort: Pulmonary effort is normal. No respiratory distress.     Breath sounds: Normal breath sounds.  Abdominal:     General: Abdomen is flat. Bowel sounds are normal.     Palpations: Abdomen is soft.  Musculoskeletal:        General: Normal range of motion.  Lymphadenopathy:     Cervical: No cervical adenopathy.  Skin:    General: Skin is warm and dry.     Findings: Rash present. Rash is papular.     Comments: Forearms and chest  Neurological:     General: No focal deficit present.     Mental Status: She is alert and oriented to person, place, and time. Mental status is at baseline.  Psychiatric:        Mood and Affect: Mood normal.        Behavior: Behavior normal.        Thought Content: Thought content normal.        Judgment: Judgment normal.     No results found.  No  results found for this or any previous visit (from the past 2160 hours).      Garner Nash, MD, MS

## 2023-03-26 NOTE — Telephone Encounter (Signed)
Chief Complaint: Hives Symptoms: Itching Frequency: Ongoing since yesterday evening Pertinent Negatives: Patient denies fever, SOB, tongue, lip or face swelling Disposition: [] ED /[] Urgent Care (no appt availability in office) / [x] Appointment(In office/virtual)/ []  [facility]/ [] Home Care/ [] Refused Recommended Disposition /[]  Mobile Bus/ []  Follow-up with PCP Additional Notes: Pt reports she noticed mild hives and itching yesterday evening, she notes when she awoke this AM the hives had spread and the itching was worsening and has continued to do so all morning. Pt reports she took some benadryl and a shower which helped mildly but did not provide much relief. Pt reports the hives are all small in nature, in clusters and cover her entire upper body including arms, breasts, stomach and chest. Pt denies SOB, face, lip or tongue swelling, fever. OV scheduled for today. This RN educated pt on home care, new-worsening symptoms, when to call back/seek emergent care. Pt verbalized understanding and agrees to plan.  Reason for Disposition  [1] MODERATE-SEVERE hives persist (i.e., hives interfere with normal activities or work) AND [2] taking antihistamine (e.g., Benadryl, Claritin) > 24 hours  Answer Assessment - Initial Assessment Questions 1. APPEARANCE: "What does the rash look like?"      Raised red bumps 2. LOCATION: "Where is the rash located?"      Upper body, arms, chest, breast, stomach 3. NUMBER: "How many hives are there?"      "Way too many to count" 4. SIZE: "How big are the hives?" (inches, cm, compare to coins) "Do they all look the same or is there lots of variation in shape and size?"      All different sizes, all small in nature, in clusters 5. ONSET: "When did the hives begin?" (Hours or days ago)      Noticed a few last night, has been worsening all morning 6. ITCHING: "Does it itch?" If Yes, ask: "How bad is the itch?"    - MILD: doesn't interfere with  normal activities   - MODERATE-SEVERE: interferes with work, school, sleep, or other activities      Intense constant itching 7. RECURRENT PROBLEM: "Have you had hives before?" If Yes, ask: "When was the last time?" and "What happened that time?"      Had hives several years ago, this time seems more instense 8. TRIGGERS: "Were you exposed to any new food, plant, cosmetic product or animal just before the hives began?"     "Yesterday my Adderall dose changed from 20 mg to 25 mg" 9. OTHER SYMPTOMS: "Do you have any other symptoms?" (e.g., fever, tongue swelling, difficulty breathing, abdomen pain)     None  Protocols used: Hives-A-AH

## 2023-03-26 NOTE — Telephone Encounter (Signed)
Scheduled for an appt with Dr Janee Morn 03/26/23. Dm/cma

## 2023-05-14 ENCOUNTER — Encounter: Payer: Self-pay | Admitting: Podiatry

## 2023-05-14 ENCOUNTER — Ambulatory Visit (INDEPENDENT_AMBULATORY_CARE_PROVIDER_SITE_OTHER): Payer: Self-pay | Admitting: Podiatry

## 2023-05-14 DIAGNOSIS — M76822 Posterior tibial tendinitis, left leg: Secondary | ICD-10-CM | POA: Diagnosis not present

## 2023-05-14 MED ORDER — BETAMETHASONE SOD PHOS & ACET 6 (3-3) MG/ML IJ SUSP
3.0000 mg | Freq: Once | INTRAMUSCULAR | Status: AC
  Administered 2023-05-14: 3 mg via INTRA_ARTICULAR

## 2023-05-14 NOTE — Progress Notes (Signed)
   Chief Complaint  Patient presents with   Follow-up    Patient states that she would like steroid injection, states that it worked very well last time and would like it again.     HPI: 33 y.o. female presenting today for follow-up evaluation of posterior tibial tendinitis to the left ankle.  Patient does have a history of left posterior tibial tendon surgery 2019.  Patient was last seen in the office January 2023 and she says the injection helped beautifully.  Patient began having pain about 1 month ago due to increased activity and exercise.  Past Medical History:  Diagnosis Date   Anxiety    Depression     Past Surgical History:  Procedure Laterality Date   ANKLE SURGERY Left    2021   KNEE SURGERY  03/04/2010    Allergies  Allergen Reactions   Latex Anaphylaxis     Physical Exam: General: The patient is alert and oriented x3 in no acute distress.  Dermatology: Skin is warm, dry and supple bilateral lower extremities. Negative for open lesions or macerations.  Vascular: Palpable pedal pulses bilaterally. Capillary refill within normal limits.  Negative for any significant edema or erythema  Neurological: Light touch and protective threshold grossly intact  Musculoskeletal Exam: No pedal deformities noted.  Moderate tenderness to palpation along the posterior tibial tendon just inferior to the medial malleolus  MR ANKLE LT WO CONTRAST 03/07/2021: IMPRESSION: 1. Postsurgical changes involving the PT tendon, with anchor in the navicular. Mild tendinosis of the inframalleolar PT tendon. No acute tendon tear. 2. Intact ankle ligaments.  No acute osseous abnormality.  Assessment: 1. H/o PT tendon surgery with anchor into the navicular LT.  2019 in [place] 2.  Posterior tibial tendinitis/tendinosis left   Plan of Care:  1. Patient evaluated.   2.  Injection of 0.5 cc Celestone Soluspan injected along the posterior tibial tendon left just inferior to the medial  malleolus 3.  Continue meloxicam 15 mg as needed.  Patient has an active prescription at home 4.  Continue OTC arch supports.  The patient has gone through 6 pairs of custom molded orthotics with no improvement previously 5.  Continue ankle brace as needed.  The patient has 3 different ankle braces that she has tried 6.  Return to clinic as needed  *Works for a [employer] company      Felecia Shelling, [facility]  Dr. Felecia Shelling, DPM    [address] [place], [place] [address]                Office [phone]  Fax [phone]

## 2023-05-28 ENCOUNTER — Ambulatory Visit: Payer: Self-pay | Admitting: Podiatry

## 2023-07-10 ENCOUNTER — Encounter: Payer: Self-pay | Admitting: Family Medicine

## 2023-07-22 ENCOUNTER — Ambulatory Visit (HOSPITAL_BASED_OUTPATIENT_CLINIC_OR_DEPARTMENT_OTHER)
Admission: RE | Admit: 2023-07-22 | Discharge: 2023-07-22 | Disposition: A | Discharge status: Home / Self Care | Source: Ambulatory Visit | Attending: Internal Medicine | Admitting: Internal Medicine

## 2023-07-22 ENCOUNTER — Encounter: Payer: Self-pay | Admitting: Internal Medicine

## 2023-07-22 ENCOUNTER — Ambulatory Visit: Admitting: Internal Medicine

## 2023-07-22 ENCOUNTER — Ambulatory Visit: Payer: Self-pay | Admitting: Internal Medicine

## 2023-07-22 VITALS — BP 122/80 | HR 118 | Temp 98.8°F | Ht 64.0 in | Wt 248.8 lb

## 2023-07-22 DIAGNOSIS — J101 Influenza due to other identified influenza virus with other respiratory manifestations: Secondary | ICD-10-CM | POA: Insufficient documentation

## 2023-07-22 DIAGNOSIS — R0609 Other forms of dyspnea: Secondary | ICD-10-CM | POA: Insufficient documentation

## 2023-07-22 DIAGNOSIS — J189 Pneumonia, unspecified organism: Secondary | ICD-10-CM

## 2023-07-22 DIAGNOSIS — R051 Acute cough: Secondary | ICD-10-CM

## 2023-07-22 LAB — POCT INFLUENZA A/B
Influenza A, POC: NEGATIVE
Influenza B, POC: POSITIVE — AB

## 2023-07-22 LAB — POCT RAPID STREP A (OFFICE): Rapid Strep A Screen: NEGATIVE

## 2023-07-22 LAB — POC COVID19 BINAXNOW: SARS Coronavirus 2 Ag: NEGATIVE

## 2023-07-22 MED ORDER — HYDROCODONE BIT-HOMATROP MBR 5-1.5 MG/5ML PO SOLN: 5.0000 mL | Freq: Four times a day (QID) | ORAL | 0 refills | Status: DC | PRN

## 2023-07-22 MED ORDER — ALBUTEROL SULFATE HFA 108 (90 BASE) MCG/ACT IN AERS: 2.0000 | INHALATION_SPRAY | RESPIRATORY_TRACT | 2 refills | Status: DC | PRN

## 2023-07-22 MED ORDER — LEVOFLOXACIN 750 MG PO TABS: 750.0000 mg | ORAL_TABLET | Freq: Every day | ORAL | 0 refills | Status: DC

## 2023-07-22 NOTE — Patient Instructions (Addendum)
 Nausea:  Phenergan  - 1 tab every 8 hours as needed for nausea  Bland diet, push fluids (pedialyte, clear liquids)   Diarrhea  Pepto-bismol, imodium as needed , bland diet   Fever  Tylenol  and Ibuprofen     Go for chest xray now     Follow up in office if symptoms worsen or do not improve

## 2023-07-22 NOTE — Progress Notes (Signed)
 [facility] [address] [place] [address] Dept: [phone] Dept Fax: [phone]  Acute Care Office Visit  Subjective:   Anna Vargas 03/13/1990 07/22/2023  Chief Complaint  Patient presents with   Fever   Cough   Sore Throat   Diarrhea    All symptoms started on Friday     HPI: Anna Vargas is a 33 yo F who presents complaining of fever, cough, sore throat, diarrhea onset 5 days ago. Fever 101-102.80F. Diarrhea every hour during day but none at nighttime. Sore throat has improved from initial presentation.   Associated symptoms: loss of appetite, nausea. Also reports SHOB with exertion, heaviness in chest, productive cough. Runny nose, nasal congestion started today.   Denies blood in stools, vomiting.   Treatments tried: Tylenol  - but fever returns immediately after wears off. Robitussin DM , Cepacol lozenges, Ibuprofen , Steam shower, Vicks vapor rub , hot tea  Recent sick contacts: mom - URI symptoms but quickly resolved   She does report having Influenza with complication of Pneumonia in the past.      The following portions of the patient's history were reviewed and updated as appropriate: past medical history, past surgical history, family history, social history, allergies, medications, and problem list.   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   Arthritis of left knee 04/03/2021    Past Medical History:  Diagnosis Date   Anxiety    Depression    Past Surgical History:  Procedure Laterality Date   ANKLE SURGERY Left    2021   KNEE SURGERY  03/04/2010   Family History  Problem Relation Age of Onset   Hypertension Mother    Hyperlipidemia Father    Hypertension Father    Breast cancer Maternal Aunt 64 - 49   Lung cancer Maternal Grandfather    Heart disease Paternal Grandfather     Current Outpatient Medications:    ALPRAZolam (XANAX) 0.5 MG tablet, Take 0.5 mg by mouth as needed., Disp: , Rfl:    amphetamine-dextroamphetamine (ADDERALL XR) 20 MG 24 hr capsule, Take by mouth every morning., Disp: , Rfl:    buPROPion ER (WELLBUTRIN SR) 100 MG 12 hr tablet, Take 100 mg by mouth every morning., Disp: , Rfl:    citalopram (CELEXA) 40 MG tablet, Take 40 mg by mouth at bedtime., Disp: , Rfl:    Dextromethorphan-buPROPion ER (AUVELITY) 45-105 MG TBCR, Take 1 tablet by mouth in the morning and at bedtime., Disp: , Rfl:    glycopyrrolate  (ROBINUL ) 2 MG tablet, Take 2 tablets (4 mg total) by mouth daily., Disp: 180 tablet, Rfl: 3   levonorgestrel  (MIRENA ) 20 MCG/DAY IUD, by Intrauterine route. INSERTED 10/10/21, Disp: , Rfl:    rizatriptan  (MAXALT ) 5 MG tablet, Take 1-2 tablets (5-10 mg total) by mouth as needed for migraine. May repeat in 2 hours if needed. No more than 30mg  in 24hrs, Disp: 10 tablet, Rfl: 0   VRAYLAR 3 MG capsule, Take 3 mg  by mouth at bedtime., Disp: , Rfl:    hydrOXYzine  (VISTARIL ) 25 MG capsule, Take 1-2 capsules (25-50 mg total) by mouth every 6 (six) hours as needed for itching., Disp: 30 capsule, Rfl: 0   nystatin -triamcinolone  ointment (MYCOLOG), Apply 1 Application topically 2 (two) times daily., Disp: 30 g, Rfl: 1   promethazine  (PHENERGAN ) 25 MG tablet, Take 1 tablet (25 mg total) by mouth every 8 (eight) hours as needed for nausea or vomiting., Disp: 21 tablet, Rfl: 0   triamcinolone  ointment (KENALOG ) 0.5 %, Apply 1 Application topically  2 (two) times daily. For moderate to severe eczema.  Do not use for more than 1 week at a time., Disp: 60 g, Rfl: 3 Allergies  Allergen Reactions   Latex Anaphylaxis     ROS: A complete ROS was performed with pertinent positives/negatives noted in the HPI. The remainder of the ROS are negative.    Objective:   Today's Vitals   07/22/23 1020  BP: 122/80  Pulse: (!) 118  Temp: 98.8 F (37.1 C)  TempSrc: Temporal  SpO2: 100%  Weight: 248 lb 12.8 oz (112.9 kg)  Height: 5\' 4"  (1.626 m)    GENERAL: ill-appearing, non-toxic. in NAD.  SKIN: Pink, warm and dry. No rash.  HEENT:    HEAD: Normocephalic, non-traumatic.  EYES: Conjunctive pink without exudate.  EARS: External ear w/o redness, swelling, masses, or lesions. EAC clear. TM's intact, translucent w/o bulging, appropriate landmarks visualized.  NOSE: Septum midline w/o deformity. Nares patent, mucosa pink and inflamed w/o drainage. Minimal frontal sinus tenderness.  THROAT: Uvula midline. Oropharynx erythematous. Tonsils absent. Mucus membranes pink and moist.  NECK: Trachea midline. Full ROM w/o pain or tenderness. No lymphadenopathy.  RESPIRATORY: Chest wall symmetrical. Respirations even and non-labored. Breath sounds clear to auscultation bilaterally. (+) congested cough CARDIAC: S1, S2 present, tachycardic regular rate and rhythm. Peripheral pulses 2+ bilaterally.  EXTREMITIES: Without clubbing, cyanosis, or edema.  NEUROLOGIC: Steady, even gait.  PSYCH/MENTAL STATUS: Alert, oriented x 3. Cooperative, appropriate mood and affect.    Results for orders placed or performed in visit on 07/22/23  POCT Influenza A/B  Result Value Ref Range   Influenza A, POC Negative Negative   Influenza B, POC Positive (A) Negative  POCT rapid strep A  Result Value Ref Range   Rapid Strep A Screen Negative Negative  POC COVID-19 BinaxNow  Result Value Ref Range   SARS Coronavirus 2 Ag Negative Negative      Assessment & Plan:  1.  Influenza B (Primary) - POCT Influenza A/B - POCT rapid strep A - POC COVID-19 BinaxNow - DG Chest 2 View; Future - discussed supportive care measures . Out of window to start Tamiflu .  - phenergan  (patient has Rx at home) 1 tab q8hr PRN nausea  - Bland diet, push fluids (Pedialyte, clear liquids)  - pepto-bismol , imodium for diarrhea  - continue tylenol  and ibuprofen  for fever management  2. Acute cough - DG Chest 2 View; Future - Robitussin DM OTC   3. Dyspnea on exertion - DG Chest 2 View; Future - Patient reports hx of PNA as complication of Flu in past, will obtain CXR due to reported dyspnea on exertion, cough, heaviness in chest.   No orders of the defined types were placed in this encounter.  Orders Placed This Encounter  Procedures   DG Chest 2 View    Standing Status:   Future    Expiration Date:   01/22/2024    Reason for  Exam (SYMPTOM  OR DIAGNOSIS REQUIRED):   productive cough, fever, SHOB with exertion , heaviness in chest x 5 days. + flu B. hx of PNA with flu    Is the patient pregnant?:   No    Preferred imaging location?:   Internal   POCT Influenza A/B   POCT rapid strep A   POC COVID-19 BinaxNow    Previously tested for COVID-19:   No    Resident in a congregate (group) care setting:   No    Employed in healthcare setting:   No    Pregnant:   No   Lab Orders         POCT Influenza A/B         POCT rapid strep A         POC COVID-19 BinaxNow     No images are attached to the encounter or orders placed in the encounter.  Return if symptoms worsen or fail to improve.   Gavin Kast, FNP

## 2023-07-23 MED ORDER — AZITHROMYCIN 250 MG PO TABS: ORAL_TABLET | ORAL | 0 refills | Status: AC

## 2023-07-23 MED ORDER — AMOXICILLIN-POT CLAVULANATE 875-125 MG PO TABS: 1.0000 | ORAL_TABLET | Freq: Two times a day (BID) | ORAL | 0 refills | Status: AC

## 2023-07-23 NOTE — Addendum Note (Signed)
 Addended by: Gavin Kast on: 07/23/2023 11:02 AM   Modules accepted: Orders

## 2023-07-24 NOTE — Progress Notes (Signed)
 [facility] [address] [place] [address] Dept: [phone] Dept Fax: [phone]  Acute Care Office Visit  Subjective:   Anna Vargas 1990-10-12 07/25/2023  Chief Complaint  Patient presents with   Follow-up    SOB, deep breathing while walking     HPI: Anna Vargas is a 33 yo F who presents for follow up of CAP and influenza B.  Patient was seen by primary care on 07/22/2023 and diagnosed with influenza B and pneumonia.  X-ray on 07/22/2023 showed multifocal confluent opacities in the left upper and bilateral lower lungs.  Patient has been taking azithromycin  and Augmentin  as prescribed.  She has taken 2 days dosing so far.  She reports feeling better since starting antibiotics but still fatigued and short of breath, which worsens with exertion.  She reports no fevers since Tuesday night.  Diarrhea, nausea have resolved.  She reports increase fluid intake and appetite is improved.  Cough syrup is helping.  Does not report much improvement with albuterol  inhaler.  She did get a pulse oximeter at the pharmacy, reports oxygen levels dropped to 83% RA on Tuesday night, but after starting the antibiotics they have remained above 90% RA consistently.    Narrative & Impression  CLINICAL DATA:  Influenza B positive with 5 day history of productive cough, fever, shortness of breath and chest heaviness   EXAM: CHEST - 2 VIEW   COMPARISON:  Chest radiograph dated 05/28/2022   FINDINGS: Normal lung volumes. Multifocal confluent opacities in the left upper and bilateral lower lungs. No pleural effusion or pneumothorax. The heart size and mediastinal contours are within normal limits. No acute osseous abnormality.   IMPRESSION: Multifocal confluent opacities in the left upper and bilateral lower lungs, suspicious for multifocal pneumonia.     Electronically Signed   By: Limin  Xu M.D.   On: 07/22/2023 13:24    The  following portions of the patient's history were reviewed and updated as appropriate: past medical history, past surgical history, family history, social history, allergies, medications, and problem list.   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   Arthritis of left knee 04/03/2021   Past Medical History:  Diagnosis Date   Anxiety    Depression    Past Surgical History:  Procedure Laterality Date   ANKLE SURGERY Left    2021   KNEE SURGERY  03/04/2010   Family History  Problem Relation Age of Onset   Hypertension Mother    Hyperlipidemia Father    Hypertension Father    Breast cancer Maternal Aunt 15 - 49   Lung cancer Maternal Grandfather    Heart disease Paternal Grandfather     Current Outpatient Medications:    albuterol  (VENTOLIN  HFA) 108 (90 Base) MCG/ACT inhaler, Inhale 2 puffs into the lungs every 4 (four) hours as needed for wheezing or shortness of breath., Disp: 18 g, Rfl: 2   ALPRAZolam (XANAX) 0.5 MG tablet, Take 0.5 mg by mouth as  needed., Disp: , Rfl:    amoxicillin -clavulanate (AUGMENTIN ) 875-125 MG tablet, Take 1 tablet by mouth 2 (two) times daily for 7 days., Disp: 14 tablet, Rfl: 0   amphetamine-dextroamphetamine (ADDERALL XR) 20 MG 24 hr capsule, Take by mouth every morning., Disp: , Rfl:    azithromycin  (ZITHROMAX ) 250 MG tablet, Take 2 tablets on day 1, then 1 tablet daily on days 2 through 5, Disp: 6 tablet, Rfl: 0   buPROPion ER (WELLBUTRIN SR)  100 MG 12 hr tablet, Take 100 mg by mouth every morning., Disp: , Rfl:    citalopram (CELEXA) 40 MG tablet, Take 40 mg by mouth at bedtime., Disp: , Rfl:    Dextromethorphan-buPROPion ER (AUVELITY) 45-105 MG TBCR, Take 1 tablet by mouth in the morning and at bedtime., Disp: , Rfl:    glycopyrrolate  (ROBINUL ) 2 MG tablet, Take 2 tablets (4 mg total) by mouth daily., Disp: 180 tablet, Rfl: 3   HYDROcodone  bit-homatropine (HYCODAN) 5-1.5 MG/5ML syrup, Take 5 mLs by mouth every 6 (six) hours as needed for cough., Disp: 120 mL, Rfl: 0   ipratropium-albuterol  (DUONEB) 0.5-2.5 (3) MG/3ML SOLN, Take 3 mLs by nebulization every 4 (four) hours as needed., Disp: 360 mL, Rfl: 1   levonorgestrel  (MIRENA ) 20 MCG/DAY IUD, by Intrauterine route. INSERTED 10/10/21, Disp: , Rfl:    Misc. Devices MISC, Nebulizer machine with tubing, Disp: 1 each, Rfl: 0   predniSONE (STERAPRED UNI-PAK 21 TAB) 10 MG (21) TBPK tablet, Take as directed on packaging., Disp: 21 tablet, Rfl: 0   promethazine  (PHENERGAN ) 25 MG tablet, Take 1 tablet (25 mg total) by mouth every 8 (eight) hours as needed for nausea or vomiting., Disp: 21 tablet, Rfl: 0   rizatriptan  (MAXALT ) 5 MG tablet, Take 1-2 tablets (5-10 mg total) by mouth as needed for migraine. May repeat in 2 hours if needed. No more than 30mg  in 24hrs, Disp: 10 tablet, Rfl: 0   VRAYLAR 3 MG capsule, Take 3 mg by mouth at bedtime., Disp: , Rfl:    hydrOXYzine  (VISTARIL ) 25 MG capsule, Take 1-2 capsules (25-50 mg total) by mouth every 6 (six) hours as needed for itching., Disp: 30 capsule, Rfl: 0   nystatin -triamcinolone  ointment (MYCOLOG), Apply 1 Application topically 2 (two) times daily., Disp: 30 g, Rfl: 1   triamcinolone  ointment (KENALOG ) 0.5 %, Apply 1 Application topically 2 (two) times daily. For moderate to severe eczema.  Do not use for more than 1 week at a time., Disp: 60 g, Rfl: 3 Allergies  Allergen Reactions   Latex Anaphylaxis     ROS: A complete ROS was performed  with pertinent positives/negatives noted in the HPI. The remainder of the ROS are negative.    Objective:   Today's Vitals   07/25/23 1059  BP: 124/78  Pulse: (!) 101  Temp: 97.7 F (36.5 C)  TempSrc: Temporal  SpO2: 100%  Weight: 252 lb 3.2 oz (114.4 kg)  Height: 5\' 4"  (1.626 m)    GENERAL: ill-appearing, non-toxic, in NAD. Well nourished.  Improved presentation from 07/22/2023 SKIN: Pink, warm and dry. No rash, lesion, ulceration, or ecchymoses.  HEENT:    HEAD: Normocephalic, non-traumatic.  EYES: Conjunctive pink without exudate.  THROAT: Uvula midline. Oropharynx clear. Tonsils non-inflamed w/o exudate. Mucus membranes pink and moist.  NECK: Trachea midline. Full ROM w/o pain or tenderness. No lymphadenopathy.  RESPIRATORY: Chest wall symmetrical. Respirations even and non-labored. Breath sounds clear to auscultation, but diminished.  bilaterally. (+) Cough CARDIAC: S1, S2 present, regular rate  and rhythm. Peripheral pulses 2+ bilaterally.  EXTREMITIES: Without clubbing, cyanosis, or edema.  NEUROLOGIC: Steady, even gait.  PSYCH/MENTAL STATUS: Alert, oriented x 3. Cooperative, appropriate mood and affect.   POST NEB TX : Respirations even, nonlabored.  CTAB.  Improved aeration throughout bilaterally.  Patient reports improved inspiration.  No results found for any visits on 07/25/23.    Assessment & Plan:  1. Multifocal pneumonia (Primary) - ipratropium-albuterol  (DUONEB) 0.5-2.5 (3) MG/3ML nebulizer solution 3 mL - predniSONE (STERAPRED UNI-PAK 21 TAB) 10 MG (21) TBPK tablet; Take as directed on packaging.  Dispense: 21 tablet; Refill: 0 - ipratropium-albuterol  (DUONEB) 0.5-2.5 (3) MG/3ML SOLN; Take 3 mLs by nebulization every 4 (four) hours as needed.  Dispense: 360 mL; Refill: 1 - Misc. Devices MISC; Nebulizer machine with tubing  Dispense: 1 each; Refill: 0 - Ambulatory referral to Pulmonology - Advised patient to continue taking azithromycin  and Augmentin  as  prescribed - Adding prednisone pack and duo nebulizers to treatment plan  -Patient has a history of bilateral pneumonia in 2022, and now recurrence of CAP.  I discussed with patient that it would be beneficial for her to be evaluated by pulmonology due to the reoccurrence of pneumonia with minimal high risk factors for pneumonia.   2. Influenza B   Meds ordered this encounter  Medications   ipratropium-albuterol  (DUONEB) 0.5-2.5 (3) MG/3ML nebulizer solution 3 mL   predniSONE (STERAPRED UNI-PAK 21 TAB) 10 MG (21) TBPK tablet    Sig: Take as directed on packaging.    Dispense:  21 tablet    Refill:  0    Supervising Provider:   THOMPSON, AARON B [[id number]]   ipratropium-albuterol  (DUONEB) 0.5-2.5 (3) MG/3ML SOLN    Sig: Take 3 mLs by nebulization every 4 (four) hours as needed.    Dispense:  360 mL    Refill:  1    Supervising Provider:   THOMPSON, AARON B [[id number]]   Misc. Devices MISC    Sig: Nebulizer machine with tubing    Dispense:  1 each    Refill:  0    Supervising Provider:   Catheryn Cluck [[id number]]   Orders Placed This Encounter  Procedures   Ambulatory referral to Pulmonology    Referral Priority:   Routine    Referral Type:   Consultation    Referral Reason:   Specialty Services Required    Requested Specialty:   Pulmonary Disease    Number of Visits Requested:   1   Lab Orders  No laboratory test(s) ordered today   No images are attached to the encounter or orders placed in the encounter.  Return in about 5 days (around 07/30/2023) for pneumonia .   Gavin Kast, FNP

## 2023-07-25 ENCOUNTER — Encounter: Payer: Self-pay | Admitting: Internal Medicine

## 2023-07-25 ENCOUNTER — Ambulatory Visit: Admitting: Internal Medicine

## 2023-07-25 VITALS — BP 124/78 | HR 101 | Temp 97.7°F | Ht 64.0 in | Wt 252.2 lb

## 2023-07-25 DIAGNOSIS — J189 Pneumonia, unspecified organism: Secondary | ICD-10-CM

## 2023-07-25 DIAGNOSIS — J101 Influenza due to other identified influenza virus with other respiratory manifestations: Secondary | ICD-10-CM

## 2023-07-25 MED ORDER — IPRATROPIUM-ALBUTEROL 0.5-2.5 (3) MG/3ML IN SOLN
3.0000 mL | Freq: Once | RESPIRATORY_TRACT | Status: AC
  Administered 2023-07-25: 3 mL via RESPIRATORY_TRACT

## 2023-07-25 MED ORDER — IPRATROPIUM-ALBUTEROL 0.5-2.5 (3) MG/3ML IN SOLN: 3.0000 mL | RESPIRATORY_TRACT | 1 refills | Status: DC | PRN

## 2023-07-25 MED ORDER — PREDNISONE 10 MG (21) PO TBPK
ORAL_TABLET | ORAL | 0 refills | Status: DC
Start: 2023-07-25 — End: 2023-08-06

## 2023-07-25 MED ORDER — MISC. DEVICES MISC: 0 refills | Status: DC

## 2023-07-29 ENCOUNTER — Ambulatory Visit: Payer: Self-pay | Admitting: Nurse Practitioner

## 2023-07-29 ENCOUNTER — Ambulatory Visit: Admitting: Nurse Practitioner

## 2023-07-29 ENCOUNTER — Encounter: Payer: Self-pay | Admitting: Nurse Practitioner

## 2023-07-29 ENCOUNTER — Ambulatory Visit: Payer: Self-pay | Admitting: *Deleted

## 2023-07-29 VITALS — BP 120/82 | HR 98 | Temp 97.2°F | Ht 64.0 in | Wt 252.6 lb

## 2023-07-29 DIAGNOSIS — R0609 Other forms of dyspnea: Secondary | ICD-10-CM | POA: Diagnosis not present

## 2023-07-29 DIAGNOSIS — J189 Pneumonia, unspecified organism: Secondary | ICD-10-CM | POA: Diagnosis not present

## 2023-07-29 DIAGNOSIS — K625 Hemorrhage of anus and rectum: Secondary | ICD-10-CM | POA: Insufficient documentation

## 2023-07-29 LAB — COMPREHENSIVE METABOLIC PANEL WITH GFR
ALT: 27 U/L (ref 0–35)
AST: 20 U/L (ref 0–37)
Albumin: 4.4 g/dL (ref 3.5–5.2)
Alkaline Phosphatase: 65 U/L (ref 39–117)
BUN: 15 mg/dL (ref 6–23)
CO2: 26 meq/L (ref 19–32)
Calcium: 9.4 mg/dL (ref 8.4–10.5)
Chloride: 102 meq/L (ref 96–112)
Creatinine, Ser: 0.84 mg/dL (ref 0.40–1.20)
GFR: 91.14 mL/min (ref >60.00)
Glucose, Bld: 93 mg/dL (ref 70–99)
Potassium: 3.8 meq/L (ref 3.5–5.1)
Sodium: 137 meq/L (ref 135–145)
Total Bilirubin: 0.4 mg/dL (ref 0.2–1.2)
Total Protein: 7.4 g/dL (ref 6.0–8.3)

## 2023-07-29 LAB — CBC WITH DIFFERENTIAL/PLATELET
Basophils Absolute: 0 10*3/uL (ref 0.0–0.1)
Basophils Relative: 0.2 % (ref 0.0–3.0)
Eosinophils Absolute: 0 10*3/uL (ref 0.0–0.7)
Eosinophils Relative: 0.3 % (ref 0.0–5.0)
HCT: 41.5 % (ref 36.0–46.0)
Hemoglobin: 14 g/dL (ref 12.0–15.0)
Lymphocytes Relative: 13.1 % (ref 12.0–46.0)
Lymphs Abs: 2.3 10*3/uL (ref 0.7–4.0)
MCHC: 33.9 g/dL (ref 30.0–36.0)
MCV: 86.5 fl (ref 78.0–100.0)
Monocytes Absolute: 1.2 10*3/uL — ABNORMAL HIGH (ref 0.1–1.0)
Monocytes Relative: 6.8 % (ref 3.0–12.0)
Neutro Abs: 14.3 10*3/uL — ABNORMAL HIGH (ref 1.4–7.7)
Neutrophils Relative %: 79.6 % — ABNORMAL HIGH (ref 43.0–77.0)
Platelets: 413 10*3/uL — ABNORMAL HIGH (ref 150.0–400.0)
RBC: 4.79 Mil/uL (ref 3.87–5.11)
RDW: 12.8 % (ref 11.5–15.5)
WBC: 17.9 10*3/uL — ABNORMAL HIGH (ref 4.0–10.5)

## 2023-07-29 MED ORDER — HYDROCORTISONE ACETATE 25 MG RE SUPP
25.0000 mg | Freq: Two times a day (BID) | RECTAL | 0 refills | Status: DC
Start: 2023-07-29 — End: 2023-08-25

## 2023-07-29 MED ORDER — DOXYCYCLINE HYCLATE 100 MG PO TABS: 100.0000 mg | ORAL_TABLET | Freq: Two times a day (BID) | ORAL | 0 refills | Status: AC

## 2023-07-29 MED ORDER — BUDESONIDE-FORMOTEROL FUMARATE 160-4.5 MCG/ACT IN AERO: 2.0000 | INHALATION_SPRAY | Freq: Two times a day (BID) | RESPIRATORY_TRACT | 0 refills | Status: DC

## 2023-07-29 NOTE — Patient Instructions (Addendum)
 Go to lab Use mucinex  dm or robitussin for cough Continue albuterol  as needed for cough and shortness of breath Start Symbicort inhaler 2x/day, rinse mouth after each use.

## 2023-07-29 NOTE — Progress Notes (Addendum)
 Established Patient Visit  Patient: Anna Vargas   DOB: 04-24-1990   33 y.o. Female  MRN: [id number] Visit Date: 07/29/2023  Subjective:     Chief Complaint  Patient presents with   Rectal Bleeding     loose stools with blood and sometimes just blood only   Shortness of Breath    Having trouble breathing   Rectal Bleeding   Shortness of Breath   Multifocal pneumonia Treated with prednisone dose pack, azithromycin  and augmentin - 07/23/23-07/29/23 Today she reports persistent SOB with exertion only. Associated to Non productive cough Denies any CP or palpitation or fever. Minimal improvement with albuterol  HHI  Add symbicort ihaler, maintain albuterol  prn Check cbc and cmp  Blood per rectum Onset yesterday x3episodes, had 2episodes today, blood in commode Associated with loose stool- stool is not bloody nor black. Denies any ABDOMEN pain, no rectal pain. No external hemorrhoid noted on exam today  Check CBC and CMP today. Provided ifob kit.  Reviewed medical, surgical, and social history today  Medications: Outpatient Medications Prior to Visit  Medication Sig   albuterol  (VENTOLIN  HFA) 108 (90 Base) MCG/ACT inhaler Inhale 2 puffs into the lungs every 4 (four) hours as needed for wheezing or shortness of breath.   ALPRAZolam (XANAX) 0.5 MG tablet Take 0.5 mg by mouth as needed.   amoxicillin -clavulanate (AUGMENTIN ) 875-125 MG tablet Take 1 tablet by mouth 2 (two) times daily for 7 days.   amphetamine-dextroamphetamine (ADDERALL XR) 20 MG 24 hr capsule Take by mouth every morning.   buPROPion ER (WELLBUTRIN SR) 100 MG 12 hr tablet Take 100 mg by mouth every morning.   citalopram (CELEXA) 40 MG tablet Take 40 mg by mouth at bedtime.   Dextromethorphan-buPROPion ER (AUVELITY) 45-105 MG TBCR Take 1 tablet by mouth in the morning and at bedtime.   glycopyrrolate  (ROBINUL ) 2 MG tablet Take 2 tablets (4 mg total) by mouth daily.   levonorgestrel  (MIRENA ) 20  MCG/DAY IUD by Intrauterine route. INSERTED 10/10/21   Misc. Devices MISC Nebulizer machine with tubing   predniSONE (STERAPRED UNI-PAK 21 TAB) 10 MG (21) TBPK tablet Take as directed on packaging.   VRAYLAR 3 MG capsule Take 3 mg by mouth at bedtime.   [DISCONTINUED] HYDROcodone  bit-homatropine (HYCODAN) 5-1.5 MG/5ML syrup Take 5 mLs by mouth every 6 (six) hours as needed for cough. (Patient not taking: Reported on 07/29/2023)   [DISCONTINUED] hydrOXYzine  (VISTARIL ) 25 MG capsule Take 1-2 capsules (25-50 mg total) by mouth every 6 (six) hours as needed for itching. (Patient not taking: Reported on 07/29/2023)   [DISCONTINUED] ipratropium-albuterol  (DUONEB) 0.5-2.5 (3) MG/3ML SOLN Take 3 mLs by nebulization every 4 (four) hours as needed. (Patient not taking: Reported on 07/29/2023)   [DISCONTINUED] nystatin -triamcinolone  ointment (MYCOLOG) Apply 1 Application topically 2 (two) times daily. (Patient not taking: Reported on 07/29/2023)   [DISCONTINUED] promethazine  (PHENERGAN ) 25 MG tablet Take 1 tablet (25 mg total) by mouth every 8 (eight) hours as needed for nausea or vomiting. (Patient not taking: Reported on 07/29/2023)   [DISCONTINUED] rizatriptan  (MAXALT ) 5 MG tablet Take 1-2 tablets (5-10 mg total) by mouth as needed for migraine. May repeat in 2 hours if needed. No more than 30mg  in 24hrs (Patient not taking: Reported on 07/29/2023)   [DISCONTINUED] triamcinolone  ointment (KENALOG ) 0.5 % Apply 1 Application topically 2 (two) times daily. For moderate to severe eczema.  Do not use for more than 1 week at  a time. (Patient not taking: Reported on 07/29/2023)   No facility-administered medications prior to visit.   Reviewed past medical and social history.   ROS per HPI above      Objective:  BP 120/82 (BP Location: Left Arm, Patient Position: Sitting, Cuff Size: Normal)   Pulse 98   Temp (!) 97.2 F (36.2 C) (Temporal)   Ht 5\' 4"  (1.626 m)   Wt 252 lb 9.6 oz (114.6 kg)   SpO2 99%   BMI  43.36 kg/m      Physical Exam Vitals and nursing note reviewed. Exam conducted with a chaperone present.  Constitutional:      General: She is not in acute distress.    Appearance: She is not ill-appearing.  Cardiovascular:     Rate and Rhythm: Normal rate and regular rhythm.     Pulses: Normal pulses.     Heart sounds: Normal heart sounds.  Pulmonary:     Effort: Pulmonary effort is normal.     Breath sounds: Normal breath sounds.  Abdominal:     General: Bowel sounds are normal. There is no distension.     Palpations: Abdomen is soft.     Tenderness: There is no abdominal tenderness. There is no guarding.  Genitourinary:    Rectum: No mass, tenderness, anal fissure or external hemorrhoid.  Neurological:     Mental Status: She is alert and oriented to person, place, and time.     Results for orders placed or performed in visit on 07/29/23  Comprehensive metabolic panel with GFR  Result Value Ref Range   Sodium 137 135 - 145 mEq/L   Potassium 3.8 3.5 - 5.1 mEq/L   Chloride 102 96 - 112 mEq/L   CO2 26 19 - 32 mEq/L   Glucose, Bld 93 70 - 99 mg/dL   BUN 15 6 - 23 mg/dL   Creatinine, Ser 1.61 0.40 - 1.20 mg/dL   Total Bilirubin 0.4 0.2 - 1.2 mg/dL   Alkaline Phosphatase 65 39 - 117 U/L   AST 20 0 - 37 U/L   ALT 27 0 - 35 U/L   Total Protein 7.4 6.0 - 8.3 g/dL   Albumin 4.4 3.5 - 5.2 g/dL   GFR 09.60 >45.40 mL/min   Calcium 9.4 8.4 - 10.5 mg/dL  CBC with Differential/Platelet  Result Value Ref Range   WBC 17.9 (H) 4.0 - 10.5 K/uL   RBC 4.79 3.87 - 5.11 Mil/uL   Hemoglobin 14.0 12.0 - 15.0 g/dL   HCT 98.1 19.1 - 47.8 %   MCV 86.5 78.0 - 100.0 fl   MCHC 33.9 30.0 - 36.0 g/dL   RDW 29.5 62.1 - 30.8 %   Platelets 413.0 (H) 150.0 - 400.0 K/uL   Neutrophils Relative % 79.6 (H) 43.0 - 77.0 %   Lymphocytes Relative 13.1 12.0 - 46.0 %   Monocytes Relative 6.8 3.0 - 12.0 %   Eosinophils Relative 0.3 0.0 - 5.0 %   Basophils Relative 0.2 0.0 - 3.0 %   Neutro Abs 14.3 (H)  1.4 - 7.7 K/uL   Lymphs Abs 2.3 0.7 - 4.0 K/uL   Monocytes Absolute 1.2 (H) 0.1 - 1.0 K/uL   Eosinophils Absolute 0.0 0.0 - 0.7 K/uL   Basophils Absolute 0.0 0.0 - 0.1 K/uL      Assessment & Plan:    Problem List Items Addressed This Visit     Blood per rectum   Onset yesterday x3episodes, had 2episodes today, blood in commode  Associated with loose stool- stool is not bloody nor black. Denies any ABDOMEN pain, no rectal pain. No external hemorrhoid noted on exam today  Check CBC and CMP today. Provided ifob kit.      Relevant Orders   Comprehensive metabolic panel with GFR (Completed)   CBC with Differential/Platelet (Completed)   Fecal occult blood, imunochemical   Multifocal pneumonia - Primary   Treated with prednisone dose pack, azithromycin  and augmentin - 07/23/23-07/29/23 Today she reports persistent SOB with exertion only. Associated to Non productive cough Denies any CP or palpitation or fever. Minimal improvement with albuterol  HHI  Add symbicort ihaler, maintain albuterol  prn Check cbc and cmp      Relevant Medications   budesonide-formoterol (SYMBICORT) 160-4.5 MCG/ACT inhaler   Other Relevant Orders   Comprehensive metabolic panel with GFR (Completed)   CBC with Differential/Platelet (Completed)   Other Visit Diagnoses       Dyspnea on exertion       Relevant Medications   budesonide-formoterol (SYMBICORT) 160-4.5 MCG/ACT inhaler   Other Relevant Orders   Comprehensive metabolic panel with GFR (Completed)   CBC with Differential/Platelet (Completed)     Normal RBC, hgb and hct, CMP Elevated WBC and neutrophil: this could be related to recent use of oral prednisone and pneumonia infection. Stop augmentin . Start doxycycline 100mg  BID since you were hesitant about taking lexaquin. Also start probiotic-Align or [place]urel[place] 1cap daily to prevent worsening diarrhea. Blood per rectum possibly related to bleeding internal hemorrhoid. Sent proctosol  suppositiory  Return in 3 days (on 08/01/2023) for Pneumonia, diarrhea, blood per rectum with me or Odilia Bennett or Dr. Therese Flash.Kathrene Parents, NP

## 2023-07-29 NOTE — Assessment & Plan Note (Addendum)
 Treated with prednisone dose pack, azithromycin  and augmentin - 07/23/23-07/29/23 Today she reports persistent SOB with exertion only. Associated to Non productive cough Denies any CP or palpitation or fever. Minimal improvement with albuterol  HHI  Add symbicort ihaler, maintain albuterol  prn Check cbc and cmp

## 2023-07-29 NOTE — Telephone Encounter (Signed)
 Copied from CRM [id number]. Topic: Clinical - Red Word Triage >> Jul 29, 2023  7:43 AM Kita Perish H wrote: [facility] that prompted transfer to Nurse Triage: Bloody diarrhea, still having the difficulty breathing Reason for Disposition  [1] SEVERE diarrhea (e.g., 7 or more times / day more than normal) AND [2] age > 60 years  Answer Assessment - Initial Assessment Questions 1. DIARRHEA SEVERITY: "How bad is the diarrhea?" "How many more stools have you had in the past 24 hours than normal?"    - NO DIARRHEA (SCALE 0)   - MILD (SCALE 1-3): Few loose or mushy BMs; increase of 1-3 stools over normal daily number of stools; mild increase in ostomy output.   -  MODERATE (SCALE 4-7): Increase of 4-6 stools daily over normal; moderate increase in ostomy output.   -  SEVERE (SCALE 8-10; OR "WORST POSSIBLE"): Increase of 7 or more stools daily over normal; moderate increase in ostomy output; incontinence.     I'm having bloody diarrhea  2. ONSET: "When did the diarrhea begin?"      Yesterday Monday.    Happened 3 times yesterday.and again this morning.  No abd pain and no fever.   I do have pneumonia.   Diagnosed this past week.  I have the flu and pneumonia.    3. BM CONSISTENCY: "How loose or watery is the diarrhea?"      3 times yesterday and one time this morning. 4. VOMITING: "Are you also vomiting?" If Yes, ask: "How many times in the past 24 hours?"      Nausea but no vomiting 5. ABDOMEN PAIN: "Are you having any abdomen pain?" If Yes, ask: "What does it feel like?" (e.g., crampy, dull, intermittent, constant)      No  6. ABDOMEN PAIN SEVERITY: If present, ask: "How bad is the pain?"  (e.g., Scale 1-10; mild, moderate, or severe)   - MILD (1-3): doesn't interfere with normal activities, abdomen soft and not tender to touch    - MODERATE (4-7): interferes with normal activities or awakens from sleep, abdomen tender to touch    - SEVERE (8-10): excruciating pain, doubled over, unable to do any normal  activities       No 7. ORAL INTAKE: If vomiting, "Have you been able to drink liquids?" "How much liquids have you had in the past 24 hours?"     Not vomiting.   I'm losing fluids.   I am on a steroid.   No diabetes. 8. HYDRATION: "Any signs of dehydration?" (e.g., dry mouth [not just dry lips], too weak to stand, dizziness, new weight loss) "When did you last urinate?"     I feel like I'm dehydrated.   No low BP and no dizziness or faint feeling. 9. EXPOSURE: "Have you traveled to a foreign country recently?" "Have you been exposed to anyone with diarrhea?" "Could you have eaten any food that was spoiled?"     Not asked 10. ANTIBIOTIC USE: "Are you taking antibiotics now or have you taken antibiotics in the past 2 months?"       I'm on last day of Augmentin  for the pneumonia.    11. OTHER SYMPTOMS: "Do you have any other symptoms?" (e.g., fever, blood in stool)       Bloody diarrhea 12. PREGNANCY: "Is there any chance you are pregnant?" "When was your last menstrual period?"       I have an IUD and not sexually active.  Protocols used: [facility]  Chief Complaint: Diagnosed with pneumonia last week.   Now having bloody diarrhea   On last day of antibiotic today.  Symptoms: Still having shortness of breath also.  Frequency: Diarrhea started yesterday, Monday and occurred 3 times.   Once this morning.    Pertinent Negatives: Patient denies No fever or abd pain, no vomiting. Disposition: [] ED /[] Urgent Care (no appt availability in office) / [x] Appointment(In office/virtual)/ []  [place] [facility]/ [] Home Care/ [] Refused Recommended Disposition /[] [place] [facility]/ []  Follow-up with PCP Additional Notes: Appt made with Kathrene Parents, NP for today at 11:20.

## 2023-07-29 NOTE — Assessment & Plan Note (Signed)
 Onset yesterday x3episodes, had 2episodes today, blood in commode Associated with loose stool- stool is not bloody nor black. Denies any ABDOMEN pain, no rectal pain. No external hemorrhoid noted on exam today  Check CBC and CMP today. Provided ifob kit.

## 2023-07-30 ENCOUNTER — Emergency Department (HOSPITAL_BASED_OUTPATIENT_CLINIC_OR_DEPARTMENT_OTHER)

## 2023-07-30 ENCOUNTER — Other Ambulatory Visit: Payer: Self-pay | Admitting: Internal Medicine

## 2023-07-30 ENCOUNTER — Other Ambulatory Visit: Payer: Self-pay

## 2023-07-30 ENCOUNTER — Ambulatory Visit: Payer: Self-pay

## 2023-07-30 ENCOUNTER — Emergency Department (HOSPITAL_BASED_OUTPATIENT_CLINIC_OR_DEPARTMENT_OTHER)
Admission: EM | Admit: 2023-07-30 | Discharge: 2023-07-30 | Disposition: A | Discharge status: Home / Self Care | Attending: Emergency Medicine | Admitting: Emergency Medicine

## 2023-07-30 ENCOUNTER — Encounter (HOSPITAL_BASED_OUTPATIENT_CLINIC_OR_DEPARTMENT_OTHER): Payer: Self-pay | Admitting: Emergency Medicine

## 2023-07-30 DIAGNOSIS — R1011 Right upper quadrant pain: Secondary | ICD-10-CM | POA: Diagnosis not present

## 2023-07-30 DIAGNOSIS — R112 Nausea with vomiting, unspecified: Secondary | ICD-10-CM | POA: Insufficient documentation

## 2023-07-30 DIAGNOSIS — R1012 Left upper quadrant pain: Secondary | ICD-10-CM | POA: Diagnosis not present

## 2023-07-30 DIAGNOSIS — Z91040 Latex allergy status: Secondary | ICD-10-CM | POA: Insufficient documentation

## 2023-07-30 DIAGNOSIS — R1010 Upper abdominal pain, unspecified: Secondary | ICD-10-CM

## 2023-07-30 LAB — URINALYSIS, ROUTINE W REFLEX MICROSCOPIC
Bilirubin Urine: NEGATIVE
Glucose, UA: NEGATIVE mg/dL
Hgb urine dipstick: NEGATIVE
Ketones, ur: NEGATIVE mg/dL
Leukocytes,Ua: NEGATIVE
Nitrite: NEGATIVE
Protein, ur: NEGATIVE mg/dL
Specific Gravity, Urine: 1.02 (ref 1.005–1.030)
pH: 6 (ref 5.0–8.0)

## 2023-07-30 LAB — CBC
HCT: 41.1 % (ref 36.0–46.0)
Hemoglobin: 14.1 g/dL (ref 12.0–15.0)
MCH: 29.6 pg (ref 26.0–34.0)
MCHC: 34.3 g/dL (ref 30.0–36.0)
MCV: 86.2 fL (ref 80.0–100.0)
Platelets: 317 10*3/uL (ref 150–400)
RBC: 4.77 MIL/uL (ref 3.87–5.11)
RDW: 12.4 % (ref 11.5–15.5)
WBC: 11.4 10*3/uL — ABNORMAL HIGH (ref 4.0–10.5)
nRBC: 0 % (ref 0.0–0.2)

## 2023-07-30 LAB — COMPREHENSIVE METABOLIC PANEL WITH GFR
ALT: 26 U/L (ref 0–44)
AST: 26 U/L (ref 15–41)
Albumin: 4.3 g/dL (ref 3.5–5.0)
Alkaline Phosphatase: 82 U/L (ref 38–126)
Anion gap: 12 (ref 5–15)
BUN: 14 mg/dL (ref 6–20)
CO2: 20 mmol/L — ABNORMAL LOW (ref 22–32)
Calcium: 9.1 mg/dL (ref 8.9–10.3)
Chloride: 106 mmol/L (ref 98–111)
Creatinine, Ser: 0.86 mg/dL (ref 0.44–1.00)
GFR, Estimated: >60 mL/min (ref >60)
Glucose, Bld: 109 mg/dL — ABNORMAL HIGH (ref 70–99)
Potassium: 3.6 mmol/L (ref 3.5–5.1)
Sodium: 137 mmol/L (ref 135–145)
Total Bilirubin: 0.2 mg/dL (ref 0.0–1.2)
Total Protein: 7.2 g/dL (ref 6.5–8.1)

## 2023-07-30 LAB — PREGNANCY, URINE: Preg Test, Ur: NEGATIVE

## 2023-07-30 LAB — LIPASE, BLOOD: Lipase: 28 U/L (ref 11–51)

## 2023-07-30 MED ORDER — SODIUM CHLORIDE 0.9 % IV BOLUS
1000.0000 mL | Freq: Once | INTRAVENOUS | Status: AC
Start: 2023-07-30 — End: 2023-07-30
  Administered 2023-07-30: 1000 mL via INTRAVENOUS

## 2023-07-30 MED ORDER — ONDANSETRON HCL 4 MG/2ML IJ SOLN
4.0000 mg | Freq: Once | INTRAMUSCULAR | Status: AC
Start: 2023-07-30 — End: 2023-07-30
  Administered 2023-07-30: 4 mg via INTRAVENOUS
  Filled 2023-07-30: qty 2

## 2023-07-30 MED ORDER — ONDANSETRON 4 MG PO TBDP
4.0000 mg | ORAL_TABLET | Freq: Three times a day (TID) | ORAL | 0 refills | Status: DC | PRN
Start: 2023-07-30 — End: 2023-08-25

## 2023-07-30 MED ORDER — PROMETHAZINE HCL 25 MG RE SUPP: 25.0000 mg | Freq: Four times a day (QID) | RECTAL | 0 refills | Status: DC | PRN

## 2023-07-30 MED ORDER — FAMOTIDINE 20 MG PO TABS
20.0000 mg | ORAL_TABLET | Freq: Once | ORAL | Status: AC
  Administered 2023-07-30: 20 mg via ORAL
  Filled 2023-07-30: qty 1

## 2023-07-30 MED ORDER — ALUM & MAG HYDROXIDE-SIMETH 200-200-20 MG/5ML PO SUSP
30.0000 mL | Freq: Once | ORAL | Status: AC
  Administered 2023-07-30: 30 mL via ORAL
  Filled 2023-07-30: qty 30

## 2023-07-30 MED ORDER — ONDANSETRON 4 MG PO TBDP
4.0000 mg | ORAL_TABLET | Freq: Three times a day (TID) | ORAL | 0 refills | Status: DC | PRN
Start: 2023-07-30 — End: 2023-07-30

## 2023-07-30 MED ORDER — IOHEXOL 300 MG/ML  SOLN
100.0000 mL | Freq: Once | INTRAMUSCULAR | Status: AC | PRN
  Administered 2023-07-30: 100 mL via INTRAVENOUS

## 2023-07-30 NOTE — Telephone Encounter (Signed)
 Copied From CRM [id number]. Reason for Triage: pt called stating she has pneumonia, continues to vomit. Pt requested to speak with a nurse. Please call pt back at [phone]    Chief Complaint: nausea Symptoms: weak and dehydrated Frequency: intermittent Pertinent Negatives: Patient denies fver Disposition: [] ED /[] Urgent Care (no appt availability in office) / [x] Appointment(In office/virtual)/ []  [facility]/ [] Home Care/ [] Refused Recommended Disposition /[] [facility]/ []  Follow-up with PCP Additional Notes: Patient seen in office on 5/27 and started on doxycyline for pneumonia. Patient reports that she started vomiting last night and today she feels dehydrated and weak. Pt reports that she did eat something when she took the abx but she woke up vomiting. Pt requested something for nausea. Call made to [facility] per protocol, no answer. Will send clinic HP message to expedite follow-up. Pt has f/u appt on 5/29.   Reason for Disposition  [1] Recent medical visit within 24 hours AND [2] condition / symptoms WORSE  Answer Assessment - Initial Assessment Questions 1. MAIN CONCERN OR SYMPTOM:  "What is your main concern right now?" "What question do you have?" "What's the main symptom you're worried about?" (e.g., breathing difficulty, cough, fever. pain)     Nausea  2. ONSET: "When did the  nausea  start?"     Last night   3. BETTER-SAME-WORSE: "Are you getting better, staying the same, or getting worse compared to how you felt at your last visit to the doctor (most recent medical visit)?"     Feels worse  4. VISIT DATE: "When were you seen?" (Date)     5/27  5. VISIT DOCTOR: "What is the name of the doctor taking care of you now?"     Soyla Duverney, NP  6. VISIT DIAGNOSIS:  "What was the main symptom or problem that you were seen for?" "Were you given a diagnosis?"      Pneumonia  7. VISIT MEDICINES: "Did the doctor order any new medicines for you to use?" If Yes, ask:  "Have you filled the prescription and started taking the medicine?"      Doxycycline  8. NEXT APPOINTMENT: "Have you scheduled a follow-up appointment with your doctor?"     5/29  9. PAIN: "Is there any pain?" If Yes, ask: "How bad is it?"  (Scale 0-10; or mild, moderate, severe)    - NONE (0): no pain    - MILD (1-3): doesn't interfere with normal activities     - MODERATE (4-7): interferes with normal activities or awakens from sleep     - SEVERE (8-10): excruciating pain, unable to do any normal activities     Abdomina cramping- 4/10 when about to vomit  10. FEVER: "Do you have a fever?" If Yes, ask: "What is it, how was it measured  and when did it start?"       No  11. OTHER SYMPTOMS: "Do you have any other symptoms?"       Dehydrated, weak  Protocols used: Recent Medical Visit for Illness Follow-up Call-[facility]

## 2023-07-30 NOTE — ED Triage Notes (Signed)
 Emesis x last night . On antibiotics x pneumonia x 1 week .  Sent by pcp .

## 2023-07-30 NOTE — Telephone Encounter (Signed)
 Can you call patient and notify her to cancel her appointment for Thursday , but I need her to follow up with me on Friday . Please schedule patient for Friday appointment.

## 2023-07-30 NOTE — Telephone Encounter (Signed)
 Appt rescheduled

## 2023-07-30 NOTE — ED Provider Notes (Signed)
 [facility] Provider Note   CSN: [id number] Arrival date & time: 07/30/23  1230     History  Chief Complaint  Patient presents with   Emesis    Anna Vargas is a 33 y.o. female.   Emesis   33 year old female presents emergency department with plaints of abdominal pain, nausea, vomiting.  Symptom onset yesterday.  Was diagnosed with multifocal pneumonia on 07/22/2023.  At that time was begun on prednisone, Augmentin , azithromycin .  States that she completed each of these medications courses besides the prednisone of which she still has 2 to 3 days left of the taper.  Went to her primary care yesterday due to continued feelings of cough, general fatigue and was prescribed doxycycline for further treatment of pneumonia.  States that after taking this medication yesterday, developed upper abdominal pain, nausea and vomiting.  Patient reports 15-20+ episodes of vomiting since symptom onset.  Has had diarrhea since just before beginning on antibiotics around 8 to 10 days ago; states that this remains unchanged.  States that her cough is actually improved.  States that last fever was around 3 to 4 days ago.  Denies any chills, chest pain, shortness of breath, urinary symptoms, vaginal symptoms.  Past medical history significant for multifocal pneumonia, migraine, hypercholesterolemia, obesity, anxiety, depression  Home Medications Prior to Admission medications   Medication Sig Start Date End Date Taking? Authorizing Provider  albuterol  (VENTOLIN  HFA) 108 (90 Base) MCG/ACT inhaler Inhale 2 puffs into the lungs every 4 (four) hours as needed for wheezing or shortness of breath. 07/22/23   Gavin Kast, FNP  ALPRAZolam (XANAX) 0.5 MG tablet Take 0.5 mg by mouth as needed. 10/10/20   [provider]  amoxicillin -clavulanate (AUGMENTIN ) 875-125 MG tablet Take 1 tablet by mouth 2 (two) times daily for 7 days. 07/23/23 07/30/23  Gavin Kast, FNP   amphetamine-dextroamphetamine (ADDERALL XR) 20 MG 24 hr capsule Take by mouth every morning. 04/04/22   [provider]  budesonide-formoterol (SYMBICORT) 160-4.5 MCG/ACT inhaler Inhale 2 puffs into the lungs 2 (two) times daily. Rinse mouth after each use 07/29/23   Nche, Charlotte Lum, NP  buPROPion ER (WELLBUTRIN SR) 100 MG 12 hr tablet Take 100 mg by mouth every morning. 04/11/22   [provider]  citalopram (CELEXA) 40 MG tablet Take 40 mg by mouth at bedtime. 08/11/20   [provider]  Dextromethorphan-buPROPion ER (AUVELITY) 45-105 MG TBCR Take 1 tablet by mouth in the morning and at bedtime.    [provider]  doxycycline (VIBRA-TABS) 100 MG tablet Take 1 tablet (100 mg total) by mouth 2 (two) times daily for 7 days. 07/29/23 08/05/23  Nche, Connye Delaine, NP  glycopyrrolate  (ROBINUL ) 2 MG tablet Take 2 tablets (4 mg total) by mouth daily. 03/18/23   Graig Lawyer, MD  hydrocortisone (ANUSOL-HC) 25 MG suppository Place 1 suppository (25 mg total) rectally 2 (two) times daily. 07/29/23   Nche, Connye Delaine, NP  levonorgestrel  (MIRENA ) 20 MCG/DAY IUD by Intrauterine route. INSERTED 10/10/21 10/10/21   [provider]  Misc. Devices MISC Nebulizer machine with tubing 07/25/23   Gavin Kast, FNP  ondansetron (ZOFRAN-ODT) 4 MG disintegrating tablet Take 1 tablet (4 mg total) by mouth every 8 (eight) hours as needed for nausea or vomiting. 07/30/23   Gavin Kast, FNP  predniSONE (STERAPRED UNI-PAK 21 TAB) 10 MG (21) TBPK tablet Take as directed on packaging. 07/25/23   Gavin Kast, FNP  VRAYLAR 3 MG capsule  Take 3 mg by mouth at bedtime. 09/06/21   [provider]      Allergies    Latex    Review of Systems   Review of Systems  Gastrointestinal:  Positive for vomiting.  All other systems reviewed and are negative.   Physical Exam Updated Vital Signs BP 120/80 (BP Location: Left Arm)   Pulse 91   Temp 99 F (37.2 C) (Oral)   Resp 18    Wt 114 kg   SpO2 98%   BMI 43.14 kg/m  Physical Exam Vitals and nursing note reviewed.  Constitutional:      General: She is not in acute distress.    Appearance: She is well-developed.  HENT:     Head: Normocephalic and atraumatic.  Eyes:     Conjunctiva/sclera: Conjunctivae normal.  Cardiovascular:     Rate and Rhythm: Normal rate and regular rhythm.     Heart sounds: No murmur heard. Pulmonary:     Effort: Pulmonary effort is normal. No respiratory distress.     Breath sounds: Normal breath sounds. No wheezing, rhonchi or rales.  Abdominal:     Palpations: Abdomen is soft.     Tenderness: There is abdominal tenderness. There is no guarding.     Comments: Tenderness right upper quadrant, gastric as well as left upper quadrant with most tenderness in epigastric region.  Musculoskeletal:        General: No swelling.     Cervical back: Neck supple.  Skin:    General: Skin is warm and dry.     Capillary Refill: Capillary refill takes less than 2 seconds.  Neurological:     Mental Status: She is alert.  Psychiatric:        Mood and Affect: Mood normal.     ED Results / Procedures / Treatments   Labs (all labs ordered are listed, but only abnormal results are displayed) Labs Reviewed  COMPREHENSIVE METABOLIC PANEL WITH GFR - Abnormal; Notable for the following components:      Result Value   CO2 20 (*)    Glucose, Bld 109 (*)    All other components within normal limits  CBC - Abnormal; Notable for the following components:   WBC 11.4 (*)    All other components within normal limits  LIPASE, BLOOD  URINALYSIS, ROUTINE W REFLEX MICROSCOPIC  PREGNANCY, URINE    EKG None  Radiology No results found.  Procedures Procedures    Medications Ordered in ED Medications  iohexol (OMNIPAQUE) 300 MG/ML solution 100 mL (has no administration in time range)  ondansetron (ZOFRAN) injection 4 mg (4 mg Intravenous Given 07/30/23 1343)  sodium chloride  0.9 % bolus 1,000 mL  (1,000 mLs Intravenous New Bag/Given 07/30/23 1343)    ED Course/ Medical Decision Making/ A&P                                 Medical Decision Making Amount and/or Complexity of Data Reviewed Labs: ordered. Radiology: ordered.  Risk OTC drugs. Prescription drug management.   This patient presents to the ED for concern of upper abdominal pain, nausea, vomiting, this involves an extensive number of treatment options, and is a complaint that carries with it a high risk of complications and morbidity.  The differential diagnosis includes medication side effect, gastritis, PUD, cholecystitis, CBD pathology, SBO/LBO, volvulus, diverticulitis, appendicitis, other   Co morbidities that complicate the patient evaluation  See HPI  Additional history obtained:  Additional history obtained from EMR External records from outside source obtained and reviewed including hospital records   Lab Tests:  I Ordered, and personally interpreted labs.  The pertinent results include: Leukocytosis of 11.4.  No evidence of anemia.  Platelets within range.  Mild decrease in bicarb of 20 otherwise, electrolytes within limits.  No transaminitis. No renal dysfunction.  UA without abnormality.  Urine pregnancy negative.   Imaging Studies ordered:  I ordered imaging studies including CT abdomen pelvis I independently visualized and interpreted imaging which showed no acute abnormality.  Uterine fibroid I agree with the radiologist interpretation   Cardiac Monitoring: / EKG:  The patient was maintained on a cardiac monitor.  I personally viewed and interpreted the cardiac monitored which showed an underlying rhythm of: Sinus rhythm   Consultations Obtained:  N/a   Problem List / ED Course / Critical interventions / Medication management  Upper abdominal pain, nausea/vomiting I ordered medication including Zofran, 1 L normal saline, Maalox, Pepcid   Reevaluation of the patient after these  medicines showed that the patient improved I have reviewed the patients home medicines and have made adjustments as needed   Social Determinants of Health:  Denies tobacco, licit drug use.   Test / Admission - Considered:  Upper abdominal pain, nausea/vomiting Vitals signs within normal range and stable throughout visit. Laboratory/imaging studies significant for: See above 34 year old female presents emergency department with plaints of abdominal pain, nausea, vomiting.  Symptom onset yesterday.  Was diagnosed with multifocal pneumonia on 07/22/2023.  At that time was begun on prednisone, Augmentin , azithromycin .  States that she completed each of these medications courses besides the prednisone of which she still has 2 to 3 days left of the taper.  Went to her primary care yesterday due to continued feelings of cough, general fatigue and was prescribed doxycycline for further treatment of pneumonia.  States that after taking this medication yesterday, developed upper abdominal pain, nausea and vomiting.  Patient reports 15-20+ episodes of vomiting since symptom onset.  Has had diarrhea since just before beginning on antibiotics around 8 to 10 days ago; states that this remains unchanged.  States that her cough is actually improved.  States that last fever was around 3 to 4 days ago.  Denies any chills, chest pain, shortness of breath, urinary symptoms, vaginal symptoms. On exam, tenderness with most focal tenderness in epigastric region.  Labs without evidence of acute emergent process.  CT imaging reassuring without any acute intra-abdominal pathology but with uterine fibroid.  Suspect the patient's symptoms likely secondary to doxycycline that she began just prior to symptom onset.  Treated with medications the emergency department did note significant improvement.  Lungs visualized in the CT scan did not show evidence of pneumonia but full visualization of lung fields was not obtained.  Given his  reported improvement of cough, fever, hypoxia, tachycardia and clear lung sounds bilaterally, will hold on additional antibiotics given reassuring workup.  Will prescribe antiemetic to use as needed and recommended continued use of prebiotic/probiotic.  Recommend close follow-up with PCP in the outpatient setting for reassessment.  Treatment plan discussed with patient and she acknowledged understanding was agreeable to said plan.  Patient overall well-appearing, afebrile in no acute distress. Worrisome signs and symptoms were discussed with the patient, and the patient acknowledged understanding to return to the ED if noticed. Patient was stable upon discharge.          Final Clinical Impression(s) / ED Diagnoses  Final diagnoses:  None    Rx / DC Orders ED Discharge Orders     None         [place], [place] 07/30/23 1641    Scarlette Currier, MD 07/31/23 [phone]

## 2023-07-30 NOTE — Telephone Encounter (Signed)
 Copied from CRM [id number]. Topic: Clinical - Pink Word Triage >> Jul 30, 2023  8:03 AM Anna Vargas wrote: Reason for Triage: pt called stating she has pneumonia, continues to vomit. Pt requested to speak with a nurse. Please call pt back at [phone]

## 2023-07-30 NOTE — Discharge Instructions (Signed)
 As discussed, your workup today was overall reassuring.  CT scan did not show anything obvious abnormality inside your abdomen or pelvis as cause of your symptoms.  The lower half of your lungs were clear of pneumonia.  Suspect your symptoms are likely secondary from the doxycycline that you were given.  Will send you home with nausea medicine to use as needed.  Recommend continued follow-up with your primary care for reassessment.  Please do not hesitate to return to the emergency department if the worrisome signs and symptoms are discussed become apparent.

## 2023-07-30 NOTE — Telephone Encounter (Signed)
 Please notify patient:   STOP taking doxycycline due to intolerance with vomiting. I have sent in Zofran ODT for the nausea. If vomiting persists despite taking the Zofran, she will need to go the ER for IV fluids and IV antibiotics.   START the Levaquin as previously prescribed.   Follow up with me on Friday , can cancel Thursday appointment. Will need to repeat lab work at appointment.

## 2023-07-30 NOTE — Telephone Encounter (Signed)
 Yes, I was made aware that patient had already taken phenergan  at home today, and was still vomiting. Patient was advised to go to the ER for IVF and ABX at this time.

## 2023-07-30 NOTE — Telephone Encounter (Signed)
 Patient was notified of message, patient stated she was still vomiting after taking promethazine  and asked if she needs to go to the  ED.  Gavin Kast, FNP was notified and agrees to patient seeking treatment at the ED

## 2023-07-31 ENCOUNTER — Ambulatory Visit: Admitting: Internal Medicine

## 2023-08-01 ENCOUNTER — Encounter: Payer: Self-pay | Admitting: Internal Medicine

## 2023-08-01 ENCOUNTER — Ambulatory Visit: Admitting: Internal Medicine

## 2023-08-01 VITALS — BP 110/80 | HR 92 | Temp 98.1°F | Ht 64.0 in | Wt 247.6 lb

## 2023-08-01 DIAGNOSIS — R112 Nausea with vomiting, unspecified: Secondary | ICD-10-CM

## 2023-08-01 DIAGNOSIS — J189 Pneumonia, unspecified organism: Secondary | ICD-10-CM | POA: Diagnosis not present

## 2023-08-01 DIAGNOSIS — J101 Influenza due to other identified influenza virus with other respiratory manifestations: Secondary | ICD-10-CM | POA: Diagnosis not present

## 2023-08-01 DIAGNOSIS — R197 Diarrhea, unspecified: Secondary | ICD-10-CM | POA: Diagnosis not present

## 2023-08-01 NOTE — Progress Notes (Signed)
 [facility] [address] [place] [address] Dept: [phone] Dept Fax: [phone]  Acute Care Office Visit  Subjective:   Anna Vargas 1990/05/27 08/01/2023  Chief Complaint  Patient presents with   Follow-up   Nausea   Anorexia    HPI: Discussed the use of AI scribe software for clinical note transcription with the patient, who gave verbal consent to proceed.  History of Present Illness   Anna Vargas is a 33 year old female who presents for follow up of Influenza B and Pneumonia.  She was prescribed Levaquin  on 5/20 at initial diagnosis, but due to pharmacy notifying patient of possible QT prolongation with her Celexa, patient preferred to do treatment of azithromycin  and Augmentin .  I advised patient of similar possible interaction between her Celexa and azithromycin .  She had been on Augmentin  and azithromycin  in the past (2022), but this was initially unsuccessful in treatment which led to an ER visit, she was placed on cefdinir  at that time.   Patient was then seen for follow-up in office on 07/25/2023.  Reported no significant improvement with dyspnea on exertion.  Patient was placed on prednisone  taper and DuoNeb.  Patient was seen by Kathrene Parents NP on 07/29/2023 for concerns of blood in stool, dyspnea on exertion not improving despite antibiotics, DuoNeb, and prednisone  taper.  Lab work was significant for white blood cell count 17,000.  Patient was switched to doxycycline  and given Symbicort .  However, after taking 1 dose of doxycycline  with food patient awoke the following morning with significant vomiting.  No improvement with Phenergan  that patient had at her house.  Patient went to the emergency room where she was given IV fluids.    Today, she reports her breathing has improved, with shortness of breath now primarily occurring during physical activity, such as taking her dog out. She experiences mild  shortness of breath with activity but is asymptomatic at rest. She has not used her inhaler in the last three days. She only used the nebulizer once due to adverse effects, including a rapid heart rate.  She continues to experience nausea and has no appetite, requiring Zofran  for relief after eating. Diarrhea persists but is improving, with Imodium effectively managing symptoms. No fever and a significant reduction in coughing are noted. Her white blood cell count has decreased since her last visit.  She wants to return to work and resume her routine but acknowledges that she is not yet ready to exercise or go to the gym. She is scheduled to see a pulmonologist at the end of July for further evaluation due to her history of severe pneumonia episodes.       The following portions of the patient's history were reviewed and updated as appropriate: past medical history, past surgical history, family history, social history, allergies, medications, and problem list.   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   Arthritis  of left knee 04/03/2021   Past Medical History:  Diagnosis Date   Anxiety    Depression    Past Surgical History:  Procedure Laterality Date   ANKLE SURGERY Left    2021   KNEE SURGERY  03/04/2010   Family History  Problem Relation Age of Onset   Hypertension Mother    Hyperlipidemia Father     Hypertension Father    Breast cancer Maternal Aunt 11 - 49   Lung cancer Maternal Grandfather    Heart disease Paternal Grandfather     Current Outpatient Medications:    ALPRAZolam (XANAX) 0.5 MG tablet, Take 0.5 mg by mouth as needed., Disp: , Rfl:    amphetamine-dextroamphetamine (ADDERALL XR) 20 MG 24 hr capsule, Take by mouth every morning., Disp: , Rfl:    buPROPion ER (WELLBUTRIN SR) 100 MG 12 hr tablet, Take 100 mg by mouth every morning., Disp: , Rfl:    citalopram (CELEXA) 40 MG tablet, Take 40 mg by mouth at bedtime., Disp: , Rfl:    Dextromethorphan-buPROPion ER (AUVELITY) 45-105 MG TBCR, Take 1 tablet by mouth in the morning and at bedtime., Disp: , Rfl:    levonorgestrel  (MIRENA ) 20 MCG/DAY IUD, by Intrauterine route. INSERTED 10/10/21, Disp: , Rfl:    ondansetron  (ZOFRAN -ODT) 4 MG disintegrating tablet, Take 1 tablet (4 mg total) by mouth every 8 (eight) hours as needed., Disp: 20 tablet, Rfl: 0   VRAYLAR 3 MG capsule, Take 3 mg by mouth at bedtime., Disp: , Rfl:    albuterol  (VENTOLIN  HFA) 108 (90 Base) MCG/ACT inhaler, Inhale 2 puffs into the lungs every 4 (four) hours as needed for wheezing or shortness of breath. (Patient not taking: Reported on 08/01/2023), Disp: 18 g, Rfl: 2   budesonide -formoterol  (SYMBICORT ) 160-4.5 MCG/ACT inhaler, Inhale 2 puffs into the lungs 2 (two) times daily. Rinse mouth after each use (Patient not taking: Reported on 08/01/2023), Disp: 6 g, Rfl: 0   glycopyrrolate  (ROBINUL ) 2 MG tablet, Take 2 tablets (4 mg total) by mouth daily., Disp: 180 tablet, Rfl: 3   hydrocortisone  (ANUSOL -HC) 25 MG suppository, Place 1 suppository (25 mg total) rectally 2 (two) times daily. (Patient not taking: Reported on 08/01/2023), Disp: 12 suppository, Rfl: 0   Misc. Devices MISC, Nebulizer machine with tubing (Patient not taking: Reported on 08/01/2023), Disp: 1 each, Rfl: 0   predniSONE  (STERAPRED UNI-PAK 21 TAB) 10 MG (21) TBPK tablet, Take as directed on packaging.  (Patient not taking: Reported on 08/01/2023), Disp: 21 tablet, Rfl: 0   promethazine  (PHENERGAN ) 25 MG suppository, Place 1 suppository (25 mg total) rectally every 6 (six) hours as needed for nausea or vomiting. (Patient not taking: Reported on 08/01/2023), Disp: 12 each, Rfl: 0 Allergies  Allergen Reactions   Latex Anaphylaxis     ROS: A complete ROS was performed with pertinent positives/negatives noted in the HPI. The remainder of the ROS are negative.    Objective:   Today's Vitals   08/01/23 1002  BP: 110/80  Pulse: 92  Temp: 98.1 F (36.7 C)  TempSrc: Temporal  SpO2: 98%  Weight: 247 lb 9.6 oz (112.3 kg)  Height: 5\' 4"  (1.626 m)    GENERAL: Well-appearing, in NAD. Well nourished.  SKIN: Pink, warm and dry. No rash, lesion, ulceration, or ecchymoses.  NECK: Trachea midline. Full ROM w/o pain or tenderness. No lymphadenopathy.  RESPIRATORY: Chest wall symmetrical. Respirations even and non-labored. Breath sounds clear to auscultation bilaterally.  CARDIAC: S1, S2 present, regular rate and rhythm. Peripheral  pulses 2+ bilaterally.  EXTREMITIES: Without clubbing, cyanosis, or edema.  NEUROLOGIC:  Steady, even gait.  PSYCH/MENTAL STATUS: Alert, oriented x 3. Cooperative, appropriate mood and affect.    No results found for any visits on 08/01/23.    Assessment & Plan:  Assessment and Plan    Pneumonia Significant improvement. No fever, reduced cough, improved breathing. WBC count decreased. No further antibiotics needed. - Hold off on further antibiotics. - Order follow-up chest x-ray in one month. - Use albuterol  inhaler as needed. - Consult with pulmonologist end of July.  Influenza B Significant improvement. Showing signs of recovery.  Nausea and vomiting Persistent nausea and vomiting likely related to antibiotics and recovery. Zofran  effective. - Continue Zofran  as needed. - Adopt bland diet and push fluids. - Monitor symptoms for one week; reevaluate if  unresolved or worsens.  Diarrhea Improving but still present. Likely related to antibiotics and Flu. Imodium effective. - Continue Imodium as needed.     No orders of the defined types were placed in this encounter.  Orders Placed This Encounter  Procedures   DG Chest 2 View    Standing Status:   Future    Expected Date:   08/20/2023    Expiration Date:   02/01/2024    Reason for Exam (SYMPTOM  OR DIAGNOSIS REQUIRED):   follow up Xray for pneumonia    Is the patient pregnant?:   No    Preferred imaging location?:   Internal   Lab Orders  No laboratory test(s) ordered today   No images are attached to the encounter or orders placed in the encounter.  Return for Scheduled Routine Office Visits and as needed.   Gavin Kast, FNP

## 2023-08-06 ENCOUNTER — Encounter: Payer: Self-pay | Admitting: Internal Medicine

## 2023-08-06 MED ORDER — OMEPRAZOLE 20 MG PO CPDR: 20.0000 mg | DELAYED_RELEASE_CAPSULE | Freq: Two times a day (BID) | ORAL | 0 refills | Status: DC

## 2023-08-25 ENCOUNTER — Encounter: Payer: Self-pay | Admitting: Family Medicine

## 2023-08-25 ENCOUNTER — Ambulatory Visit: Payer: Self-pay | Admitting: Family Medicine

## 2023-08-25 VITALS — BP 130/72 | HR 125 | Temp 98.4°F | Ht 64.0 in | Wt 250.8 lb

## 2023-08-25 DIAGNOSIS — Z6841 Body Mass Index (BMI) 40.0 and over, adult: Secondary | ICD-10-CM | POA: Diagnosis not present

## 2023-08-25 DIAGNOSIS — J189 Pneumonia, unspecified organism: Secondary | ICD-10-CM | POA: Diagnosis not present

## 2023-08-25 NOTE — Assessment & Plan Note (Signed)
 Maximum weight: 251  lbs (07/2023) Current weight: 250 lbs Weight change since last visit: - 1 lbs Total weight loss: - 1 lbs (0 %)  Discussed principles of weight management, including a foundation of a healthy, calorie-controlled diet and regular exercise. I do feel her current medications inhibit her ability to lose weight. We did discuss the role of medication to augment dietary and exercise efforts. I will refer her to [facility] to see if we can help her reverse the weight gain she has had over the past several years.

## 2023-08-25 NOTE — Progress Notes (Signed)
 [facility] [address] [place] [address] Dept: [phone] Dept Fax: [phone]  Office Visit  Subjective:    Patient ID: Anna Vargas, female    DOB: March 03, 1991, 33 y.o..   MRN: [id number]  Chief Complaint  Patient presents with   Follow-up    Wants to discuss getting weight loss medication.     History of Present Illness:  Patient is in today to discuss options for weight loss. Anna Vargas has struggled with obesity ever since she was started on antipsychotics. She has focused on counting calories and is going to the gym on a regular basis. Her insurance has been unwilling to cover Wegovy or Zepbound. She feels frustrated, as her current behavioral health medications have been keeping her issues in check.  Anna Vargas was seen about a month ago with an acute respiratory illness. She was found to have influenza B with x-ray evidence of multifocal pneumonia. She feels at this point, she has recovered fully from this issue. She is scheduled to see pulmonology to follow up on having had recurrent pneumonia.  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   Arthritis of left knee 04/03/2021   Past Surgical History:  Procedure Laterality Date   ANKLE SURGERY Left    2021   FRACTURE SURGERY     KNEE SURGERY  03/04/2010   Family History  Problem Relation Age of Onset   Hypertension Mother    Hyperlipidemia Father    Hypertension Father     Breast cancer Maternal Aunt 52 - 49   Lung cancer Maternal Grandfather    Heart disease Paternal Grandfather    Cancer Maternal Aunt    Outpatient Medications Prior to Visit  Medication Sig Dispense Refill   albuterol  (VENTOLIN  HFA) 108 (90 Base) MCG/ACT inhaler Inhale 2 puffs into the lungs every 4 (four) hours as needed for wheezing or shortness of breath. 18 g 2   ALPRAZolam (XANAX) 0.5 MG tablet Take 0.5 mg by mouth as needed.     amphetamine-dextroamphetamine (ADDERALL XR) 20 MG 24 hr capsule Take by mouth every morning.     buPROPion ER (WELLBUTRIN SR) 100 MG 12 hr tablet Take 100 mg by mouth every morning.     citalopram (CELEXA) 40 MG tablet Take 40 mg by mouth at bedtime.     Dextromethorphan-buPROPion ER (AUVELITY) 45-105 MG TBCR Take 1 tablet by mouth in the morning and at bedtime.     glycopyrrolate  (ROBINUL ) 2 MG tablet Take 2 tablets (4 mg total) by mouth daily. 180 tablet 3   levonorgestrel  (MIRENA ) 20 MCG/DAY IUD by Intrauterine route. INSERTED 10/10/21     VRAYLAR 3 MG capsule Take 3 mg by mouth at bedtime.     Misc. Devices MISC Nebulizer machine with tubing (Patient not taking: Reported on 08/01/2023) 1 each 0   budesonide -formoterol  (SYMBICORT ) 160-4.5 MCG/ACT inhaler Inhale 2 puffs into the lungs 2 (two) times daily. Rinse mouth after each use (Patient not taking: Reported on 08/01/2023) 6 g  0   hydrocortisone  (ANUSOL -HC) 25 MG suppository Place 1 suppository (25 mg total) rectally 2 (two) times daily. (Patient not taking: Reported on 08/01/2023) 12 suppository 0   omeprazole  (PRILOSEC) 20 MG capsule Take 1 capsule (20 mg total) by mouth 2 (two) times daily. 60 capsule 0   ondansetron  (ZOFRAN -ODT) 4 MG disintegrating tablet Take 1 tablet (4 mg total) by mouth every 8 (eight) hours as needed. 20 tablet 0   promethazine  (PHENERGAN ) 25 MG suppository Place 1 suppository (25 mg total) rectally every 6 (six) hours as needed for nausea or vomiting. (Patient not taking: Reported  on 08/01/2023) 12 each 0   No facility-administered medications prior to visit.   Allergies  Allergen Reactions   Latex Anaphylaxis     Objective:   Today's Vitals   08/25/23 1543  BP: 130/72  Pulse: (!) 125  Temp: 98.4 F (36.9 C)  TempSrc: Temporal  SpO2: 98%  Weight: 250 lb 12.8 oz (113.8 kg)  Height: 5' 4 (1.626 m)   Body mass index is 43.05 kg/m.   General: Well developed, well nourished. No acute distress. Lungs: Clear to auscultation bilaterally. No wheezing, rales or rhonchi. Psych: Alert and oriented. Normal mood and affect.  Health Maintenance Due  Topic Date Due   HIV Screening  Never done   HPV VACCINES (1 - 3-dose SCDM series) Never done   Cervical Cancer Screening (HPV/Pap Cotest)  Never done   Lab Results    Latest Ref Rng & Units 07/30/2023   12:44 PM 07/29/2023   11:41 AM 05/28/2022    4:31 PM  CMP  Glucose 70 - 99 mg/dL 890  93  85   BUN 6 - 20 mg/dL 14  15  9    Creatinine 0.44 - 1.00 mg/dL 9.13  9.15  9.05   Sodium 135 - 145 mmol/L 137  137  134   Potassium 3.5 - 5.1 mmol/L 3.6  3.8  4.5   Chloride 98 - 111 mmol/L 106  102  101   CO2 22 - 32 mmol/L 20  26  23    Calcium 8.9 - 10.3 mg/dL 9.1  9.4  9.1   Total Protein 6.5 - 8.1 g/dL 7.2  7.4  7.1   Total Bilirubin 0.0 - 1.2 mg/dL 0.2  0.4  0.8   Alkaline Phos 38 - 126 U/L 82  65  85   AST 15 - 41 U/L 26  20  30    ALT 0 - 44 U/L 26  27  26     Last hemoglobin A1c Lab Results  Component Value Date   HGBA1C 5.5 12/13/2021      Assessment & Plan:   Problem List Items Addressed This Visit       Respiratory   Multifocal pneumonia - Primary   Resolved clinically.        Other   Morbid obesity with BMI of 40.0-44.9, adult (HCC)   Maximum weight: 251  lbs (07/2023) Current weight: 250 lbs Weight change since last visit: - 1 lbs Total weight loss: - 1 lbs (0 %)  Discussed principles of weight management, including a foundation of a healthy, calorie-controlled diet and regular exercise. I  do feel her current medications inhibit her ability to lose weight. We did discuss the role of medication to augment dietary and exercise efforts. I will refer her to [facility] to see if we can help her reverse the weight gain she has had over the past  several years.        Relevant Orders   Amb Ref to Medical Weight Management    Return if symptoms worsen or fail to improve.   Garnette CHRISTELLA Simpler, MD

## 2023-08-25 NOTE — Assessment & Plan Note (Signed)
Resolved clinically

## 2023-08-27 ENCOUNTER — Encounter (INDEPENDENT_AMBULATORY_CARE_PROVIDER_SITE_OTHER): Payer: Self-pay

## 2023-09-20 IMAGING — DX DG ANKLE COMPLETE 3+V*L*
3 series · 3 of 3 positions shown · non-contrast
Comparison: None.

CLINICAL DATA: Fall, left ankle injury

EXAM:
LEFT ANKLE COMPLETE - 3+ VIEW

[ankle ap]
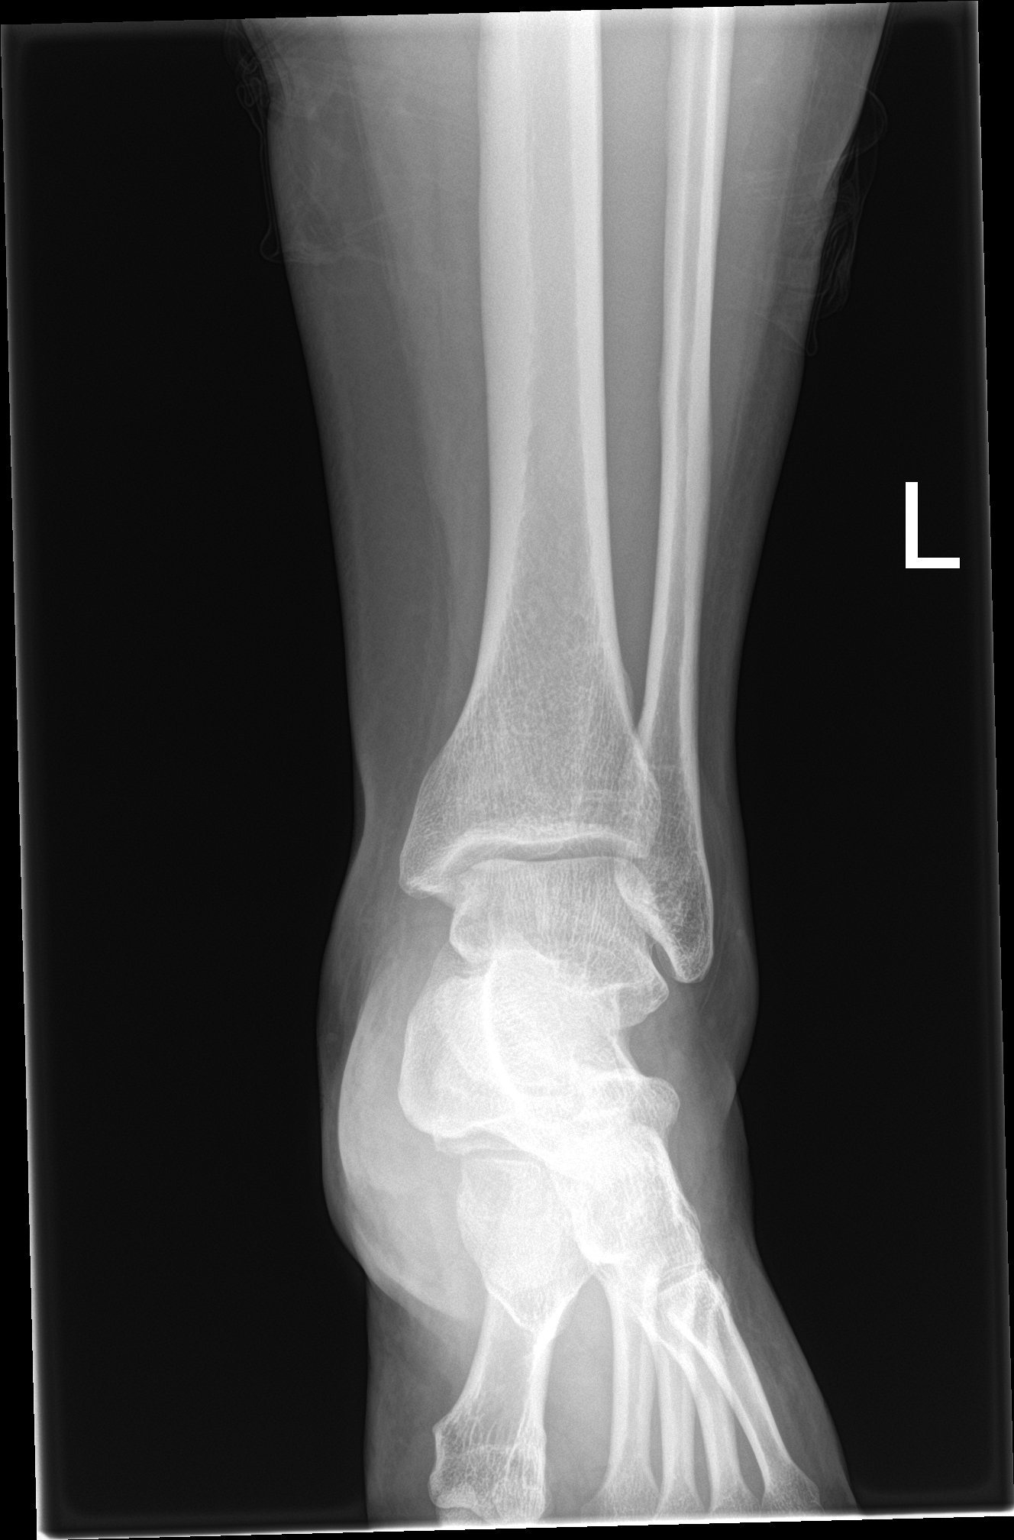

[ankle obl]
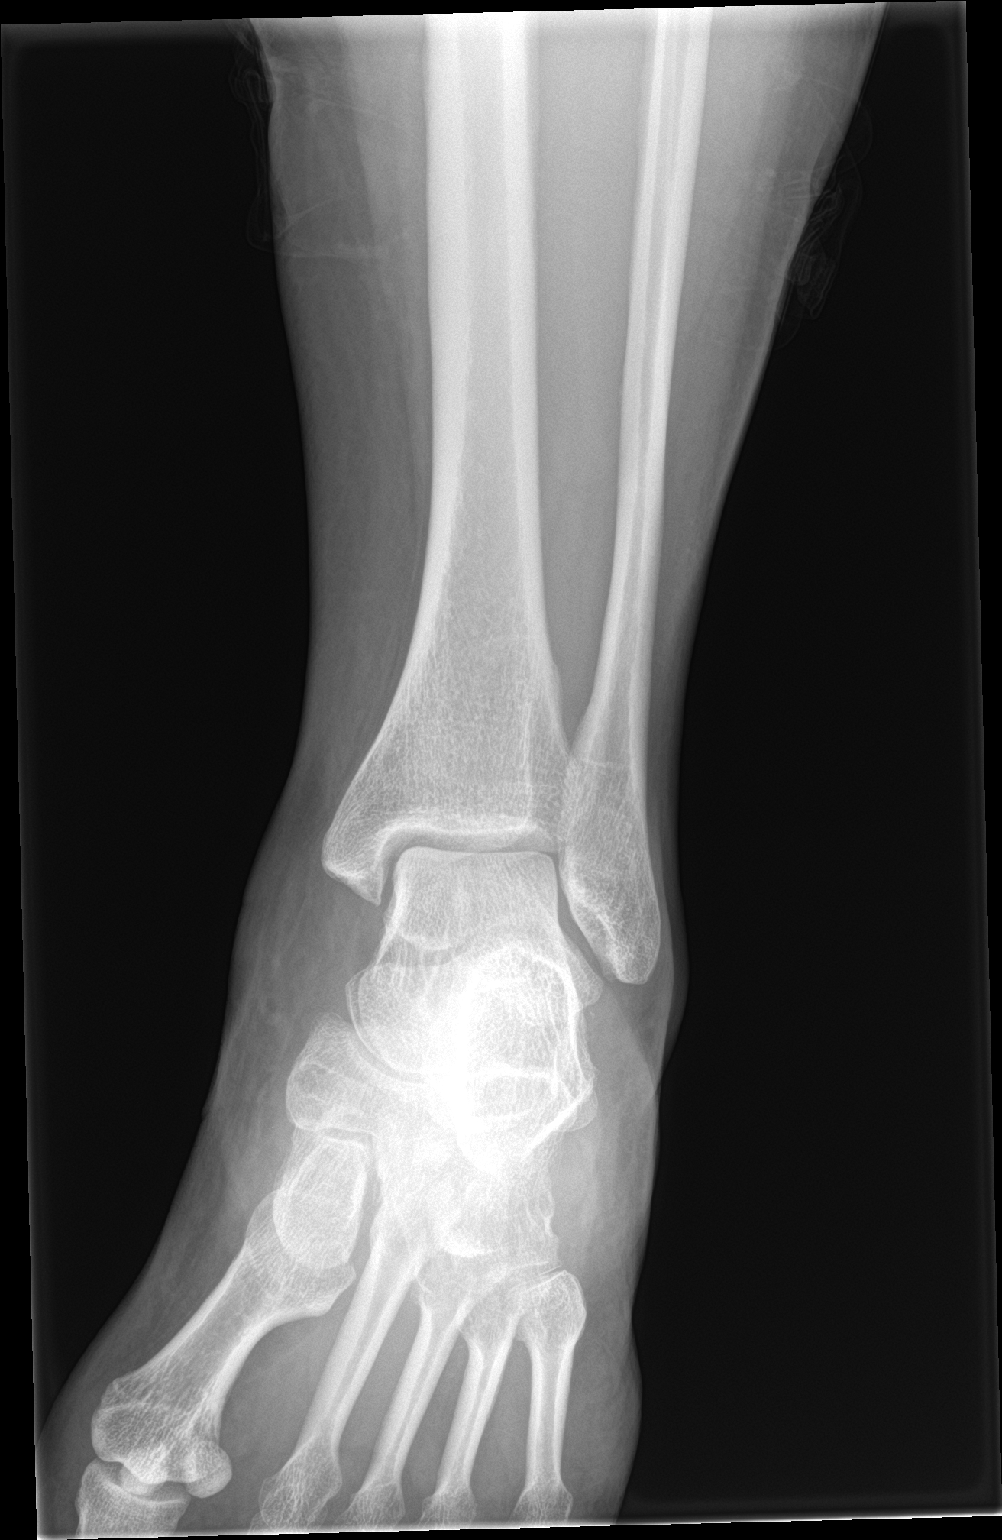

[ankle lat]
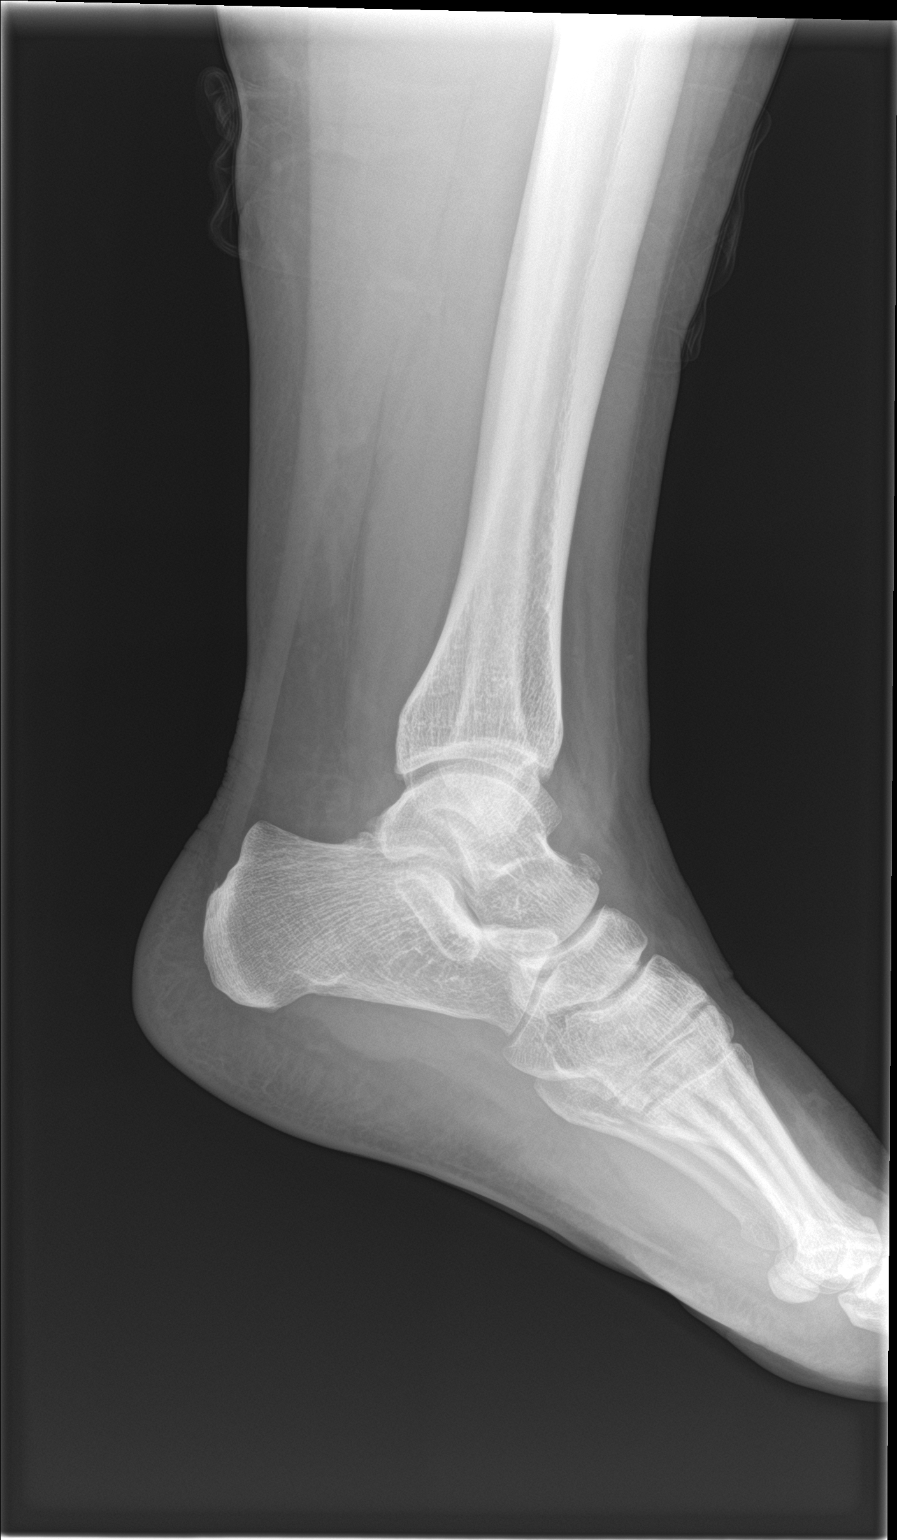

[3 of 3 positions shown; findings below may reference images not displayed]

FINDINGS: Small bone fragment noted along the anterior aspect of the distal
talus appears well corticated and may be related to old injury. No
additional acute fracture, subluxation or dislocation. Joint spaces
are maintained.
IMPRESSION: Bone fragment along the anterior distal talus appears well
corticated and may be related to old avulsion injury. Recommend
correlation for pain in this area to exclude acute avulsion.

## 2023-09-26 ENCOUNTER — Encounter: Payer: Self-pay | Admitting: Pulmonary Disease

## 2023-09-26 ENCOUNTER — Ambulatory Visit: Admitting: Pulmonary Disease

## 2023-09-26 ENCOUNTER — Other Ambulatory Visit: Payer: Self-pay | Admitting: Pulmonary Disease

## 2023-09-26 VITALS — HR 105 | Ht 64.0 in | Wt 243.0 lb

## 2023-09-26 DIAGNOSIS — R0602 Shortness of breath: Secondary | ICD-10-CM | POA: Diagnosis not present

## 2023-09-26 DIAGNOSIS — J181 Lobar pneumonia, unspecified organism: Secondary | ICD-10-CM

## 2023-09-26 DIAGNOSIS — R0683 Snoring: Secondary | ICD-10-CM | POA: Diagnosis not present

## 2023-09-26 DIAGNOSIS — G478 Other sleep disorders: Secondary | ICD-10-CM

## 2023-09-26 MED ORDER — ALBUTEROL SULFATE HFA 108 (90 BASE) MCG/ACT IN AERS: 2.0000 | INHALATION_SPRAY | Freq: Four times a day (QID) | RESPIRATORY_TRACT | 6 refills | Status: AC | PRN

## 2023-09-26 NOTE — Progress Notes (Signed)
 Anna Vargas    [id number]    04/21/1990  Primary Care Physician:Rudd, Garnette HERO, MD  Referring Physician: Billy Knee, FNP [address] [place],  [place] [address]  Chief complaint:   Concern for recurrent pneumonia Concern for sleep apnea  HPI:  Patient was referred pneumonia in May 2025, had a pneumonia about the end of December 2023 and early 2024 Symptoms did improve back to normal Did have an episode last week where she got very short of breath, chest was tight, wheezy, tachycardia There was concern for asthma Has never had a diagnosis of asthma or recurrent shortness of breath  History of snoring, nonrestorative sleep for many years No witnessed apneas No night sweats No morning headaches Memory is good No significant sleepiness during the day, able to stay alert If on the weekend yes she might take No history of reflux Non-smoker Weight is down about 12 pounds this year intentionally  Had tonsils taken out as an adult  No family history of sleep disordered breathing  She is very active, exercises regularly Outpatient Encounter Medications as of 09/26/2023  Medication Sig   ALPRAZolam (XANAX) 0.5 MG tablet Take 0.5 mg by mouth as needed.   amphetamine-dextroamphetamine (ADDERALL XR) 20 MG 24 hr capsule Take by mouth every morning.   buPROPion ER (WELLBUTRIN SR) 100 MG 12 hr tablet Take 100 mg by mouth every morning.   citalopram (CELEXA) 40 MG tablet Take 40 mg by mouth at bedtime.   Dextromethorphan-buPROPion ER (AUVELITY) 45-105 MG TBCR Take 1 tablet by mouth in the morning and at bedtime.   glycopyrrolate  (ROBINUL ) 2 MG tablet Take 2 tablets (4 mg total) by mouth daily.   levonorgestrel  (MIRENA ) 20 MCG/DAY IUD by Intrauterine route. INSERTED 10/10/21   VRAYLAR 3 MG capsule Take 3 mg by mouth at bedtime.   albuterol  (VENTOLIN  HFA) 108 (90 Base) MCG/ACT inhaler Inhale 2 puffs into the lungs every 4 (four) hours as needed for wheezing or  shortness of breath.   Misc. Devices MISC Nebulizer machine with tubing (Patient not taking: Reported on 08/01/2023)   No facility-administered encounter medications on file as of 09/26/2023.    Allergies as of 09/26/2023 - Review Complete 09/26/2023  Allergen Reaction Noted   Latex Anaphylaxis 02/10/2013    Past Medical History:  Diagnosis Date   Allergy    Latex   Anxiety    Depression     Past Surgical History:  Procedure Laterality Date   ANKLE SURGERY Left    2021   FRACTURE SURGERY     KNEE SURGERY  03/04/2010   TONSILLECTOMY      Family History  Problem Relation Age of Onset   Hypertension Mother    Hyperlipidemia Father    Hypertension Father    Breast cancer Maternal Aunt 59 - 49   Lung cancer Maternal Grandfather    Heart disease Paternal Grandfather    Cancer Maternal Aunt     Social History   Socioeconomic History   Marital status: Divorced    Spouse name: Not on file   Number of children: 0   Years of education: Not on file   Highest education level: Bachelor's degree (e.g., BA, AB, BS)  Occupational History   Occupation: Scientist, water quality    Comment: Consulting civil engineer  Tobacco Use   Smoking status: Never   Smokeless tobacco: Never  Vaping Use   Vaping status: Never Used  Substance and Sexual Activity  Alcohol use: Yes    Alcohol/week: 2.0 standard drinks of alcohol    Types: 2 Standard drinks or equivalent per week    Comment: socially   Drug use: No   Sexual activity: Never    Partners: Male    Birth control/protection: I.U.D.    Comment: Mirena  inserted 11/2015  Other Topics Concern   Not on file  Social History Narrative   2 years of pharmacy school (did not finish degree)   Social Drivers of Health   Financial Resource Strain: High Risk (08/24/2023)   Overall Financial Resource Strain (CARDIA)    Difficulty of Paying Living Expenses: Very hard  Food Insecurity: Food Insecurity Present (08/24/2023)   Hunger Vital Sign    Worried  About Running Out of Food in the Last Year: Often true    Ran Out of Food in the Last Year: Sometimes true  Transportation Needs: No Transportation Needs (08/24/2023)   PRAPARE - Administrator, Civil Service (Medical): No    Lack of Transportation (Non-Medical): No  Physical Activity: Sufficiently Active (08/24/2023)   Exercise Vital Sign    Days of Exercise per Week: 6 days    Minutes of Exercise per Session: 30 min  Stress: No Stress Concern Present (08/24/2023)   [employer] of Occupational Health - Occupational Stress Questionnaire    Feeling of Stress: Only a little  Social Connections: Moderately Isolated (08/24/2023)   Social Connection and Isolation Panel    Frequency of Communication with Friends and Family: More than three times a week    Frequency of Social Gatherings with Friends and Family: More than three times a week    Attends Religious Services: Never    Database administrator or Organizations: Yes    Attends Engineer, structural: More than 4 times per year    Marital Status: Divorced  Catering manager Violence: Not on file    Review of Systems  Respiratory:  Positive for shortness of breath.   Psychiatric/Behavioral:  Positive for sleep disturbance.     Vitals:   09/26/23 0835  Pulse: (!) 105     Physical Exam Constitutional:      Appearance: She is obese.  HENT:     Head: Normocephalic.     Mouth/Throat:     Mouth: Mucous membranes are moist.  Eyes:     General: No scleral icterus.    Pupils: Pupils are equal, round, and reactive to light.  Cardiovascular:     Rate and Rhythm: Normal rate and regular rhythm.     Heart sounds: No murmur heard.    No friction rub.  Pulmonary:     Effort: No respiratory distress.     Breath sounds: No stridor. No wheezing, rhonchi or rales.  Musculoskeletal:     Cervical back: No rigidity or tenderness.  Lymphadenopathy:     Cervical: No cervical adenopathy.  Neurological:     Mental  Status: She is alert.  Psychiatric:        Mood and Affect: Mood normal.       09/26/2023    8:00 AM  Results of the Epworth flowsheet  Sitting and reading 3  Watching TV 3  Sitting, inactive in a public place (e.g. a theatre or a meeting) 3  As a passenger in a car for an hour without a break 3  Lying down to rest in the afternoon when circumstances permit 3  Sitting and talking to someone 0  Sitting quietly  after a lunch without alcohol 3  In a car, while stopped for a few minutes in traffic 0  Total score 18     Data Reviewed: Chest x-ray May 2025 reviewed showing haziness at the bases of the lungs  Assessment:  Concern for sleep disordered breathing - Moderate obesity of significant obstructive sleep apnea - Pathophysiology of sleep disordered breathing discussed with patient - Treatment options discussed with patient - Will benefit from evaluation for sleep disordered breathing  Recurrent pneumonia - Not certain I would label her as having recurrent pneumonia - I will repeat a chest x-ray to ascertain clearance of the infiltrate - Does not have a history suggestive of immunocompromise -Important to follow-up - May need further evaluation if she were to develop another lower respiratory infection  Class III obesity - She is active, working on weight loss  Excessive daytime sleepiness - Related to untreated sleep disordered breathing  History of mood disorder - Stable at present  Concern for asthma - Does not have any longstanding history to support a diagnosis of asthma  Plan/Recommendations: Schedule for sleep study  Prescription for albuterol  to be used with shortness of breath and wheezing  Schedule for pulmonary function test to assess underlying lung function  Follow-up in about 3 months  Encouraged to continue weight loss efforts  Risks with not treating sleep disordered breathing discussed with the patient      Jennet Epley MD   Pulmonary and Critical Care 09/26/2023, 9:08 AM  CC: Billy Knee, FNP

## 2023-09-26 NOTE — Patient Instructions (Signed)
 Will see you back in 3 months  Schedule for home sleep study  Continue weight loss efforts  Chest x-ray in about a month to follow the haziness at the bottom part of the lungs to resolution  I will give you a prescription for albuterol  to be used as needed can be used up to 4 times a day  Schedule for pulmonary function test  Call us  with significant concerns

## 2023-10-15 IMAGING — DX DG CHEST 1V PORT
1 series · 1 of 1 positions shown · non-contrast
Comparison: Chest radiograph dated 08/09/2018

CLINICAL DATA: Shortness of breath and cough.

EXAM:
PORTABLE CHEST 1 VIEW

[chest]
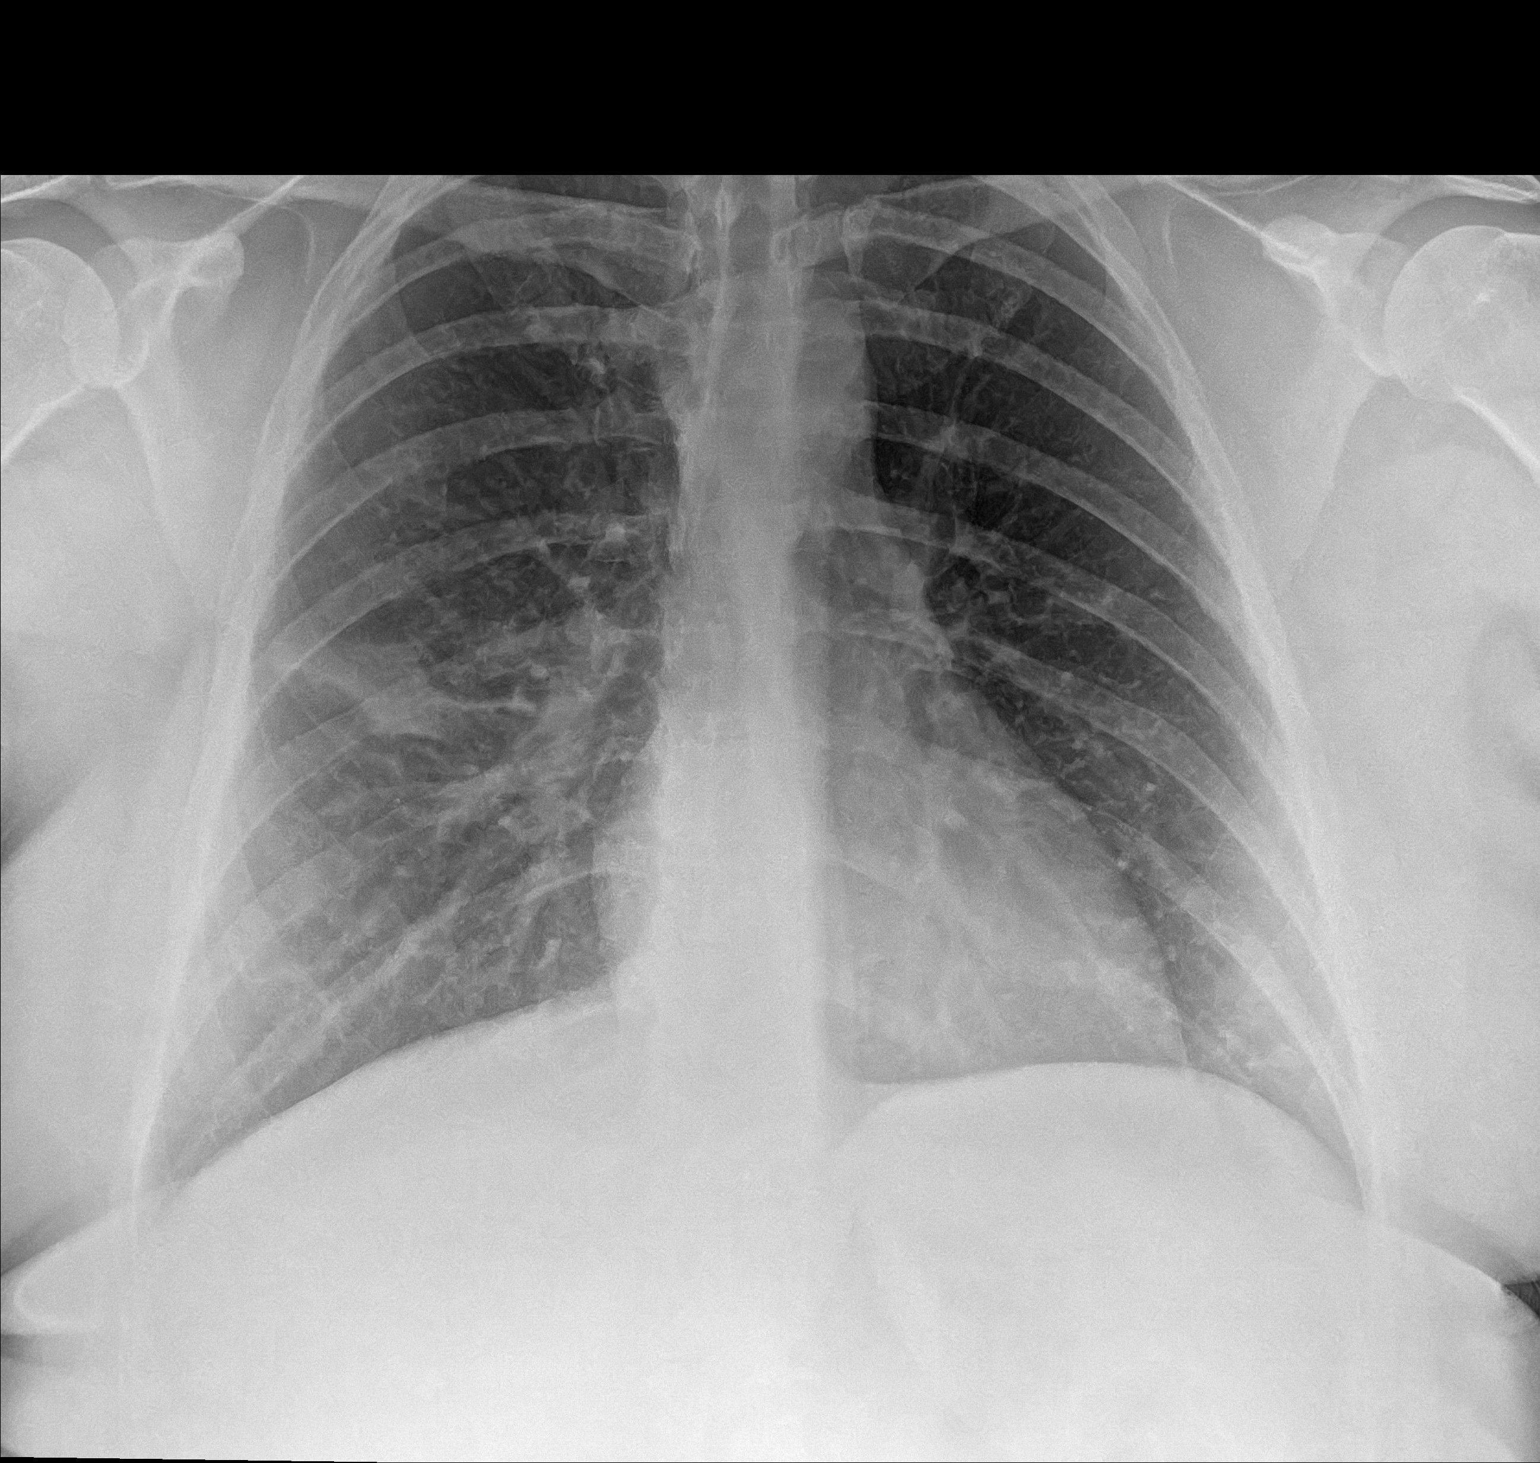

[1 of 1 positions shown; findings below may reference images not displayed]

FINDINGS: There is a focal area of airspace opacity in the right mid lung
field as well as probable smaller ill-defined opacities involving
the lower lung fields bilaterally. Findings most consistent with
pneumonia. Clinical correlation and follow-up to resolution
recommended. No pleural effusion pneumothorax. The cardiac
silhouette is within normal limits. No acute osseous pathology.
IMPRESSION: Findings most consistent with pneumonia.

## 2023-11-04 ENCOUNTER — Emergency Department (HOSPITAL_BASED_OUTPATIENT_CLINIC_OR_DEPARTMENT_OTHER)
Admission: EM | Admit: 2023-11-04 | Discharge: 2023-11-04 | Disposition: A | Discharge status: Home / Self Care | Attending: Emergency Medicine | Admitting: Emergency Medicine

## 2023-11-04 ENCOUNTER — Emergency Department (HOSPITAL_BASED_OUTPATIENT_CLINIC_OR_DEPARTMENT_OTHER)

## 2023-11-04 ENCOUNTER — Other Ambulatory Visit: Payer: Self-pay

## 2023-11-04 ENCOUNTER — Encounter (HOSPITAL_BASED_OUTPATIENT_CLINIC_OR_DEPARTMENT_OTHER): Payer: Self-pay

## 2023-11-04 DIAGNOSIS — R112 Nausea with vomiting, unspecified: Secondary | ICD-10-CM | POA: Diagnosis not present

## 2023-11-04 DIAGNOSIS — R1030 Lower abdominal pain, unspecified: Secondary | ICD-10-CM | POA: Insufficient documentation

## 2023-11-04 DIAGNOSIS — R197 Diarrhea, unspecified: Secondary | ICD-10-CM | POA: Diagnosis not present

## 2023-11-04 DIAGNOSIS — Z91040 Latex allergy status: Secondary | ICD-10-CM | POA: Diagnosis not present

## 2023-11-04 LAB — CBC
HCT: 39.7 % (ref 36.0–46.0)
Hemoglobin: 13.9 g/dL (ref 12.0–15.0)
MCH: 30.3 pg (ref 26.0–34.0)
MCHC: 35 g/dL (ref 30.0–36.0)
MCV: 86.5 fL (ref 80.0–100.0)
Platelets: 309 K/uL (ref 150–400)
RBC: 4.59 MIL/uL (ref 3.87–5.11)
RDW: 13 % (ref 11.5–15.5)
WBC: 10.5 K/uL (ref 4.0–10.5)
nRBC: 0 % (ref 0.0–0.2)

## 2023-11-04 LAB — URINALYSIS, ROUTINE W REFLEX MICROSCOPIC
Bilirubin Urine: NEGATIVE
Glucose, UA: NEGATIVE mg/dL
Ketones, ur: NEGATIVE mg/dL
Nitrite: NEGATIVE
Protein, ur: NEGATIVE mg/dL
Specific Gravity, Urine: 1.013 (ref 1.005–1.030)
WBC, UA: >50 WBC/hpf (ref 0–5)
pH: 6.5 (ref 5.0–8.0)

## 2023-11-04 LAB — COMPREHENSIVE METABOLIC PANEL WITH GFR
ALT: 25 U/L (ref 0–44)
AST: 26 U/L (ref 15–41)
Albumin: 4.4 g/dL (ref 3.5–5.0)
Alkaline Phosphatase: 85 U/L (ref 38–126)
Anion gap: 13 (ref 5–15)
BUN: 7 mg/dL (ref 6–20)
CO2: 22 mmol/L (ref 22–32)
Calcium: 9.7 mg/dL (ref 8.9–10.3)
Chloride: 104 mmol/L (ref 98–111)
Creatinine, Ser: 0.94 mg/dL (ref 0.44–1.00)
GFR, Estimated: >60 mL/min (ref >60)
Glucose, Bld: 112 mg/dL — ABNORMAL HIGH (ref 70–99)
Potassium: 3.6 mmol/L (ref 3.5–5.1)
Sodium: 139 mmol/L (ref 135–145)
Total Bilirubin: 0.3 mg/dL (ref 0.0–1.2)
Total Protein: 7.1 g/dL (ref 6.5–8.1)

## 2023-11-04 LAB — LIPASE, BLOOD: Lipase: 41 U/L (ref 11–51)

## 2023-11-04 LAB — HCG, SERUM, QUALITATIVE: Preg, Serum: NEGATIVE

## 2023-11-04 MED ORDER — DIPHENHYDRAMINE HCL 50 MG/ML IJ SOLN
25.0000 mg | Freq: Once | INTRAMUSCULAR | Status: AC
  Administered 2023-11-04: 25 mg via INTRAVENOUS
  Filled 2023-11-04: qty 1

## 2023-11-04 MED ORDER — ONDANSETRON 4 MG PO TBDP
4.0000 mg | ORAL_TABLET | Freq: Three times a day (TID) | ORAL | 0 refills | Status: AC | PRN
Start: 2023-11-04 — End: ?

## 2023-11-04 MED ORDER — ONDANSETRON HCL 4 MG/2ML IJ SOLN
4.0000 mg | Freq: Once | INTRAMUSCULAR | Status: AC
  Administered 2023-11-04: 4 mg via INTRAVENOUS
  Filled 2023-11-04: qty 2

## 2023-11-04 MED ORDER — MORPHINE SULFATE (PF) 4 MG/ML IV SOLN
4.0000 mg | Freq: Once | INTRAVENOUS | Status: AC
  Administered 2023-11-04: 4 mg via INTRAVENOUS
  Filled 2023-11-04: qty 1

## 2023-11-04 MED ORDER — DICYCLOMINE HCL 20 MG PO TABS: 20.0000 mg | ORAL_TABLET | Freq: Two times a day (BID) | ORAL | 0 refills | Status: AC

## 2023-11-04 MED ORDER — METOCLOPRAMIDE HCL 5 MG/ML IJ SOLN
10.0000 mg | Freq: Once | INTRAMUSCULAR | Status: AC
  Administered 2023-11-04: 10 mg via INTRAVENOUS
  Filled 2023-11-04: qty 2

## 2023-11-04 MED ORDER — KETOROLAC TROMETHAMINE 15 MG/ML IJ SOLN
15.0000 mg | Freq: Once | INTRAMUSCULAR | Status: AC
  Administered 2023-11-04: 15 mg via INTRAVENOUS
  Filled 2023-11-04: qty 1

## 2023-11-04 MED ORDER — OXYCODONE HCL 5 MG PO TABS
5.0000 mg | ORAL_TABLET | Freq: Once | ORAL | Status: AC
  Administered 2023-11-04: 5 mg via ORAL
  Filled 2023-11-04: qty 1

## 2023-11-04 MED ORDER — IOHEXOL 300 MG/ML  SOLN
100.0000 mL | Freq: Once | INTRAMUSCULAR | Status: AC | PRN
  Administered 2023-11-04: 100 mL via INTRAVENOUS

## 2023-11-04 MED ORDER — HYDROCODONE-ACETAMINOPHEN 5-325 MG PO TABS: 1.0000 | ORAL_TABLET | Freq: Four times a day (QID) | ORAL | 0 refills | Status: AC | PRN

## 2023-11-04 MED ORDER — SODIUM CHLORIDE 0.9 % IV BOLUS
1000.0000 mL | Freq: Once | INTRAVENOUS | Status: AC
  Administered 2023-11-04: 1000 mL via INTRAVENOUS

## 2023-11-04 NOTE — Discharge Instructions (Signed)
 Please read and follow all provided instructions.  Your diagnoses today include:  1. Lower abdominal pain     Tests performed today include: Complete blood cell count: Complete metabolic panel: Lipase (pancreas function test): Urinalysis (urine test): Pregnancy test (urine or blood, in women only): Vital signs. See below for your results today.   Medications prescribed:   Take any prescribed medications only as directed.  Home care instructions:  Follow any educational materials contained in this packet.  Follow-up instructions: Please follow-up with your primary care provider in the next 2 days for further evaluation of your symptoms.    Return instructions:  SEEK IMMEDIATE MEDICAL ATTENTION IF: The pain does not go away or becomes severe  A temperature above 101F develops  Repeated vomiting occurs (multiple episodes)  The pain becomes localized to portions of the abdomen. The right side could possibly be appendicitis. In an adult, the left lower portion of the abdomen could be colitis or diverticulitis.  Blood is being passed in stools or vomit (bright red or black tarry stools)  You develop chest pain, difficulty breathing, dizziness or fainting, or become confused, poorly responsive, or inconsolable (young children) If you have any other emergent concerns regarding your health  Additional Information: Abdominal (belly) pain can be caused by many things. Your caregiver performed an examination and possibly ordered blood/urine tests and imaging (CT scan, x-rays, ultrasound). Many cases can be observed and treated at home after initial evaluation in the emergency department. Even though you are being discharged home, abdominal pain can be unpredictable. Therefore, you need a repeated exam if your pain does not resolve, returns, or worsens. Most patients with abdominal pain don't have to be admitted to the hospital or have surgery, but serious problems like appendicitis and  gallbladder attacks can start out as nonspecific pain. Many abdominal conditions cannot be diagnosed in one visit, so follow-up evaluations are very important.  Your vital signs today were: BP (!) 104/48   Pulse 83   Temp 98.9 F (37.2 C) (Oral)   Resp 18   Ht 5' 4 (1.626 m)   Wt 110.2 kg   SpO2 100%   BMI 41.70 kg/m  If your blood pressure (bp) was elevated above 135/85 this visit, please have this repeated by your doctor within one month. --------------

## 2023-11-04 NOTE — ED Provider Notes (Signed)
 [place] EMERGENCY DEPARTMENT AT [facility] Provider Note   CSN: [id number] Arrival date & time: 11/04/23  1322     Patient presents with: Abdominal Pain and Vomiting   Anna Vargas is a 33 y.o. female with a past medical history significant for anxiety, history of migraines, and depression who presents to the ED due to lower abdominal pain associated with numerous episodes of nonbloody, nonbilious emesis x 3 days.  Also admits to a few episodes of nonbloody diarrhea.  Denies any vaginal symptoms.  No urinary symptoms.  No previous abdominal operations.  Denies chest pain and shortness of breath.  No fever or chills.  No concern for STIs.  History obtained from patient and past medical records. No interpreter used during encounter.       Prior to Admission medications   Medication Sig Start Date End Date Taking? Authorizing Provider  albuterol  (VENTOLIN  HFA) 108 (90 Base) MCG/ACT inhaler Inhale 2 puffs into the lungs every 6 (six) hours as needed for wheezing or shortness of breath. 09/26/23   Olalere, Adewale A, MD  dicyclomine  (BENTYL ) 20 MG tablet Take 1 tablet (20 mg total) by mouth 2 (two) times daily. 11/04/23  Yes Panayiotis [place], Aleck BROCKS, PA-C  ondansetron  (ZOFRAN -ODT) 4 MG disintegrating tablet Take 1 tablet (4 mg total) by mouth every 8 (eight) hours as needed for nausea or vomiting. 11/04/23  Yes Dacari Beckstrand, Aleck BROCKS, PA-C  ALPRAZolam (XANAX) 0.5 MG tablet Take 0.5 mg by mouth as needed. 10/10/20   [provider]  amphetamine-dextroamphetamine (ADDERALL XR) 20 MG 24 hr capsule Take by mouth every morning. 04/04/22   [provider]  buPROPion ER (WELLBUTRIN SR) 100 MG 12 hr tablet Take 100 mg by mouth every morning. 04/11/22   [provider]  citalopram (CELEXA) 40 MG tablet Take 40 mg by mouth at bedtime. 08/11/20   [provider]  Dextromethorphan-buPROPion ER (AUVELITY) 45-105 MG TBCR Take 1 tablet by mouth in the morning and at bedtime.     [provider]  glycopyrrolate  (ROBINUL ) 2 MG tablet Take 2 tablets (4 mg total) by mouth daily. 03/18/23   Thedora Garnette HERO, MD  levonorgestrel  (MIRENA ) 20 MCG/DAY IUD by Intrauterine route. INSERTED 10/10/21 10/10/21   [provider]  VRAYLAR 3 MG capsule Take 3 mg by mouth at bedtime. 09/06/21   [provider]    Allergies: Latex    Review of Systems  Constitutional:  Negative for fever.  Respiratory:  Negative for shortness of breath.   Cardiovascular:  Negative for chest pain.  Gastrointestinal:  Positive for abdominal pain, diarrhea, nausea and vomiting.  Genitourinary:  Negative for dysuria and vaginal discharge.    Updated Vital Signs BP (!) 116/58 (BP Location: Right Arm)   Pulse 94   Temp 99.4 F (37.4 C) (Oral)   Resp 18   Ht 5' 4 (1.626 m)   Wt 110.2 kg   SpO2 99%   BMI 41.70 kg/m   Physical Exam Vitals and nursing note reviewed.  Constitutional:      General: She is not in acute distress.    Appearance: She is not ill-appearing.  HENT:     Head: Normocephalic.  Eyes:     Pupils: Pupils are equal, round, and reactive to light.  Cardiovascular:     Rate and Rhythm: Normal rate and regular rhythm.     Pulses: Normal pulses.     Heart sounds: Normal heart sounds. No murmur heard.    No  friction rub. No gallop.  Pulmonary:     Effort: Pulmonary effort is normal.     Breath sounds: Normal breath sounds.  Abdominal:     General: Abdomen is flat. There is no distension.     Palpations: Abdomen is soft.     Tenderness: There is abdominal tenderness. There is no guarding or rebound.     Comments: +RLQ and LLQ tenderness with rebound and guarding.  Musculoskeletal:        General: Normal range of motion.     Cervical back: Neck supple.  Skin:    General: Skin is warm and dry.  Neurological:     General: No focal deficit present.     Mental Status: She is alert.  Psychiatric:        Mood and Affect: Mood normal.        Behavior:  Behavior normal.     (all labs ordered are listed, but only abnormal results are displayed) Labs Reviewed  COMPREHENSIVE METABOLIC PANEL WITH GFR - Abnormal; Notable for the following components:      Result Value   Glucose, Bld 112 (*)    All other components within normal limits  URINALYSIS, ROUTINE W REFLEX MICROSCOPIC - Abnormal; Notable for the following components:   APPearance HAZY (*)    Hgb urine dipstick TRACE (*)    Leukocytes,Ua LARGE (*)    Bacteria, UA RARE (*)    All other components within normal limits  URINE CULTURE  LIPASE, BLOOD  CBC  HCG, SERUM, QUALITATIVE    EKG: None  Radiology: No results found.   Procedures   Medications Ordered in the ED  iohexol  (OMNIPAQUE ) 300 MG/ML solution 100 mL (has no administration in time range)  sodium chloride  0.9 % bolus 1,000 mL (0 mLs Intravenous Stopped 11/04/23 1832)  ondansetron  (ZOFRAN ) injection 4 mg (4 mg Intravenous Given 11/04/23 1700)  morphine  (PF) 4 MG/ML injection 4 mg (4 mg Intravenous Given 11/04/23 1700)    Clinical Course as of 11/04/23 1832  Tue Nov 04, 2023  1635 Preg, Serum: NEGATIVE [CA]    Clinical Course User Index [CA] Lorelle Aleck BROCKS, PA-C                                 Medical Decision Making Amount and/or Complexity of Data Reviewed Labs: ordered. Decision-making details documented in ED Course. Radiology: ordered.  Risk Prescription drug management.   This patient presents to the ED for concern of abdominal pain, this involves an extensive number of treatment options, and is a complaint that carries with it a high risk of complications and morbidity.  The differential diagnosis includes appendicitis, diverticulitis, ovarian torsion, PID, ovarian cyst, etc  33 year old female presents to the ED due to lower abdominal pain associated with nausea, vomiting, and diarrhea x 3 days.  No urinary or vaginal symptoms.  Upon arrival patient afebrile and mildly tachycardic at 106 with  normal O2 saturation.  Patient appears uncomfortable in bed.  Does have right lower quadrant and left lower quadrant tenderness with rebound and guarding.  Routine labs ordered.  CT abdomen to rule out evidence of appendicitis.  IV fluids, Zofran , morphine  given. If CT negative, may need pelvic US .   CBC unremarkable.  No leukocytosis.  Normal hemoglobin.  CMP reassuring.  Normal renal function.  No major electrolyte derangements.  Hyperglycemia 112.  No anion gap.  Pregnancy test negative.  Low suspicion  for ectopic pregnancy.  UA significant for large leukocytes, rare bacteria, >50.  Appears contaminated.  Patient denies any urinary symptoms.  Will hold off on antibiotics for UTI pending urine culture and CT results.   Patient handed off to [facility], PA-C at shift change pending CT abdomen. If unremarkable and patient still has significant pain, may need pelvic US .      Final diagnoses:  Lower abdominal pain    ED Discharge Orders          Ordered    ondansetron  (ZOFRAN -ODT) 4 MG disintegrating tablet  Every 8 hours PRN        11/04/23 1832    dicyclomine  (BENTYL ) 20 MG tablet  2 times daily        11/04/23 1832               Elisha Cooksey C, PA-C 11/04/23 1845    Doretha Folks, MD 11/06/23 2055

## 2023-11-04 NOTE — ED Triage Notes (Signed)
 Arrives POV with complaints of worsening right side abdominal pain and several episode of vomiting x3 days.

## 2023-11-04 NOTE — ED Provider Notes (Signed)
 Signout from [employer] at shift change. Briefly, patient presents for right lower abdominal pain.  This was initially intermittent over the past several days but more persistent and worse today.  She had associated vomiting.   Plan: Awaiting results of CT scan.   8:36 PM Reassessment performed. Patient appears more comfortable.  She was given Reglan /Benadryl  for nausea.  This has resolved.  Continues with some mild pain.  Declines additional medications.  Labs and imaging personally reviewed and interpreted including: CBC with borderline high Mohl white blood cell count of 10.5 but normal hemoglobin; CMP glucose 112 otherwise unremarkable; lipase normal; UA with greater than 50 white blood cells, 6-10 red blood cells, not a clean-catch; pregnancy negative.  CT scan, agree negative with normal appendix.  Patient does have a 4 cm uterine fibroid which she was previously aware of.   Most current vital signs reviewed and are as follows: BP 110/67   Pulse 100   Temp 99 F (37.2 C)   Resp 18   Ht 5' 4 (1.626 m)   Wt 110.2 kg   SpO2 98%   BMI 41.70 kg/m   Plan: Given severe associated pain and tenderness with vomiting, offered pelvic ultrasound to evaluate and rule out ovarian torsion.  Will proceed.  ///  10:55 PM Reassessment performed. Patient appears stable.  She has had some recurrent pain.  Imaging results reviewed including: Transvaginal ultrasound, no evidence of ovarian torsion  Reviewed pertinent lab work and imaging with patient and family at bedside. Questions answered.   Most current vital signs reviewed and are as follows: BP (!) 104/48   Pulse 83   Temp 98.9 F (37.2 C) (Oral)   Resp 18   Ht 5' 4 (1.626 m)   Wt 110.2 kg   SpO2 100%   BMI 41.70 kg/m   Plan: Discharge to home.  Will give a dose of IV Toradol  and p.o. oxycodone  prior to discharge  Prescriptions written for: Bentyl  and Zofran  by previous provider, I have sent in # 6 tablets of Vicodin for severe  pain.  Other home care instructions discussed: Monitoring of symptoms  ED return instructions discussed: The patient was urged to return to the Emergency Department immediately with worsening of current symptoms, worsening abdominal pain, persistent vomiting, blood noted in stools, fever, or any other concerns. The patient verbalized understanding.   Follow-up instructions discussed: Patient encouraged to follow-up with their PCP in 3 days.   For this patient's complaint of abdominal pain, the following conditions were considered on the differential diagnosis: gastritis/PUD, enteritis/duodenitis, appendicitis, cholelithiasis/cholecystitis, cholangitis, pancreatitis, ruptured viscus, colitis, diverticulitis, small/large bowel obstruction, proctitis, cystitis, pyelonephritis, ureteral colic, aortic dissection, aortic aneurysm. In women, ectopic pregnancy, pelvic inflammatory disease, ovarian cysts, and tubo-ovarian abscess were also considered. Atypical chest etiologies were also considered including ACS, PE, and pneumonia.  Unclear etiology for the patient's symptoms.  She does have white blood cell count on UA microscopy, but no classic UTI symptoms.  No elevated white blood cell count.  No fevers.  Low concern for pyelonephritis.  If persistent symptoms and positive urine culture, would consider treatment, however given current clinical picture, will hold for culture results.     Desiderio Chew, PA-C 11/04/23 2259    Doretha Folks, MD 11/06/23 2103

## 2023-11-05 NOTE — Progress Notes (Signed)
 REFERRING PROVIDER:  Self  PROVIDER:  PUJA GOSAI MACZIS, PA  MRN: [id number] DOB: 1990-03-21 DATE OF ENCOUNTER: 11/05/2023  Subjective   Chief Complaint: Follow-up (Abdominal pain and vomiting. Last episode of vomiting was yesterday, per pt she has not eaten today. Feels very nauseous. )    History of Present Illness: Anna Vargas is a 33 y.o. female who was seen in the ER yesterday, 11/04/2023, for right lower quadrant abdominal pain, nausea, nonbloody, nonbilious vomiting x 1 week.  She states she vomited 6-7 times yesterday and pain also increased which prompted her ER visit.  WBC 10.5.  UA with large leukocytes and rare bacteria.  Culture in process.  CT scan showed a normal appendix, no bowel obstruction or inflammation, no bowel wall thickening.  A 4 cm uterine fibroid was noted which was not a new finding.  There was stool found throughout the colon.  Pelvic ultrasound was also performed to rule out ovarian torsion which was negative.  She does not have an appetite and has not eaten or drank anything today due to fear of having nausea/vomiting.  Zofran  does not significantly help her symptoms.  She states she had diarrhea about 3 to 4 days ago but had normal bowel movements in the last 2 days.  She has not taken any pain medication today as she has not picked it up from the pharmacy.  Of note, patient was seen in the ER on 07/30/2023 for similar issues but was thought to have a reaction to doxycycline  which was prescribed for pneumonia.  He was negative at that time as well.  No previous abdominal surgeries.  She recently completed a penicillin antibiotic after root canal.  She denies other sick contacts.   Review of Systems: A complete review of systems was obtained from the patient.  I have reviewed this information and discussed as appropriate with the patient.  See HPI as well for other ROS.  All other review of systems negative.   Medical History: Past Medical History:   Diagnosis Date  . Anxiety     There is no problem list on file for this patient.   Past Surgical History:  Procedure Laterality Date  . Ankle surgery Left 2022  . TONSILLECTOMY  2023  . Knee Surgery Left    2016/2020     No Known Allergies  Current Outpatient Medications on File Prior to Visit  Medication Sig Dispense Refill  . albuterol  MDI, PROVENTIL , VENTOLIN , PROAIR , HFA 90 mcg/actuation inhaler Inhale 2 inhalations into the lungs every 6 (six) hours as needed for Wheezing or Shortness of Breath    . ALPRAZolam (XANAX) 0.5 MG tablet TAKE 1 TABLET DAILY AS NEEDED FOR ANXIETY/PANIC 15TAB=30DAY    . buPROPion (WELLBUTRIN SR) 100 MG SR tablet Take 100 mg by mouth every morning    . citalopram (CELEXA) 40 MG tablet Take 40 mg by mouth at bedtime    . dextroamphetamine-amphetamine (ADDERALL XR) 25 MG XR capsule Take 25 mg by mouth every morning    . glycopyrrolate  (ROBINUL ) 2 MG tablet Take 4 mg by mouth once daily    . VRAYLAR 3 mg capsule Take 3 mg by mouth at bedtime     No current facility-administered medications on file prior to visit.    Family History  Problem Relation Age of Onset  . Hyperlipidemia (Elevated cholesterol) Mother   . High blood pressure (Hypertension) Mother   . Hyperlipidemia (Elevated cholesterol) Father   . Skin cancer Father  Social History   Tobacco Use  Smoking Status Never  Smokeless Tobacco Never     Social History   Socioeconomic History  . Marital status: Divorced  Tobacco Use  . Smoking status: Never  . Smokeless tobacco: Never  Vaping Use  . Vaping status: Never Used  Substance and Sexual Activity  . Alcohol use: Yes    Alcohol/week: 0.0 - 1.0 standard drinks of alcohol  . Drug use: Never  . Sexual activity: Defer   Social Drivers of Health   Housing Stability: Unknown (11/05/2023)   Housing Stability Vital Sign   . Homeless in the Last Year: No    Objective:   BP 125/85   Pulse 84   Temp 36.9 C (98.4 F)  (Temporal)   Ht 162.6 cm (5' 4)   Wt (!) 110 kg (242 lb 9.6 oz)   BMI 41.64 kg/m  Body mass index is 41.64 kg/m.  General: Well-developed, well-nourished, in no acute distress.   Eyes: No scleral icterus.  Pupils equal, lids normal Respiratory: Normal effort, no use of accessory muscles Musculoskeletal: Normal gait. Grossly normal ROM upper and lower extremities  Psychiatric: Normal judgement and insight.  Normal mood and affect.  Alert, oriented x 3  Abdomen: Soft, nondistended.  There is pain in the right lower quadrant when pressing on the left lower quadrant.  There is redness to palpation of the right lower quadrant  Assessment and Plan:   Diagnoses and all orders for this visit:  Right lower quadrant abdominal pain    Anna Vargas is presenting to the office with right lower quadrant pain, nausea, vomiting x 1 week.  Her CT scan yesterday was negative for appendicitis.  Surgeon reviewed the CT scan with me this afternoon and agrees.  However, there is moderate stool on CT so we discussed treating her constipation to see if this helps alleviate some symptoms.  She will start taking MiraLAX daily, up to 3 doses a day as needed for the next 4 to 5 days.  We also discussed reasons to go back to the ER including increased right lower quadrant pain (severity or frequency), fever, persistent vomiting, inability to tolerate food/liquids.  We discussed the importance of trying to stay hydrated.  She provided understanding and will go back to the ER if she develops any worsening issues.  Return if symptoms worsen or fail to improve.  This patient encounter took moderate decision making and 30 minutes today to perform the following: review records, take history, perform exam, counsel the patient on the diagnosis, and document encounter, findings, and plan in the EHR.  Puja Maczis, [facility]

## 2023-11-07 LAB — URINE CULTURE: Culture: 20000 — AB

## 2023-11-08 ENCOUNTER — Telehealth (HOSPITAL_BASED_OUTPATIENT_CLINIC_OR_DEPARTMENT_OTHER): Payer: Self-pay | Admitting: *Deleted

## 2023-11-08 NOTE — Telephone Encounter (Signed)
 Post ED Visit - Positive Culture Follow-up: Unsuccessful Patient Follow-up  Culture assessed and recommendations reviewed by:  [x]  Koren Or, Pharm.D. []  Venetia Gully, Pharm.D., BCPS AQ-ID []  Garrel Crews, Pharm.D., BCPS []  Almarie Lunger, Pharm.D., BCPS []  [place], [address].D., BCPS, AAHIVP []  Rosaline Bihari, Pharm.D., BCPS, AAHIVP []  Massie Rigg, PharmD []  Jodie Rower, PharmD, BCPS  Positive urine culture  [x]  Patient discharged without antimicrobial prescription and treatment may be indicated []  Organism is resistant to prescribed ED discharge antimicrobial []  Patient with positive blood cultures  Plan: call for symptom check. If better, no antibiotics If symptoms- Cefadroxil 500mg  BID x 5 days per Dr. Mliss Boyers  Unable to contact patient  letter will be sent to address on file  Anna Vargas 11/08/2023, 11:51 AM

## 2023-12-22 ENCOUNTER — Ambulatory Visit: Admitting: Pulmonary Disease

## 2023-12-24 ENCOUNTER — Ambulatory Visit: Payer: Self-pay

## 2023-12-24 NOTE — Telephone Encounter (Signed)
 Noted. Per nurse triage note, pt will be going to urgent care to be seen for her migraine as the first available appt was not until the 24th.

## 2023-12-24 NOTE — Telephone Encounter (Signed)
 FYI Only or Action Required?: FYI only for provider.  Patient was last seen in primary care on 08/25/2023 by Thedora Garnette HERO, MD.  Called Nurse Triage reporting Migraine.  Symptoms began several days ago.  Interventions attempted: OTC medications: Excedrin.  Symptoms are: unchanged.  Triage Disposition: See Physician Within 24 Hours  Patient/caregiver understands and will follow disposition?: Yes  **Patient stated she will be seen in UC today as the soonest avail. Appt. Per Epic is on 10/24**      Copied from CRM #[id number]. Topic: Clinical - Red Word Triage >> Dec 24, 2023  1:30 PM Sasha M wrote: Red Word that prompted transfer to Nurse Triage: pt has severe migraine pain since sunday Reason for Disposition  [1] MODERATE headache (e.g., interferes with normal activities) AND [2] present > 24 hours AND [3] unexplained  (Exceptions: Pain medicines not tried, typical migraine, or headache part of viral illness.)  Answer Assessment - Initial Assessment Questions 1. LOCATION: Where does it hurt?   Behind Right eye area  2. ONSET: When did the headache start? (e.g., minutes, hours, days)      X 4 days  3. PATTERN: Does the pain come and go, or has it been constant since it started?     Intermittent   4. SEVERITY: How bad is the pain? and What does it keep you from doing?  (e.g., Scale 1-10; mild, moderate, or severe)     7/10  5. RECURRENT SYMPTOM: Have you ever had headaches before? If Yes, ask: When was the last time? and What happened that time?      Yes last year  6. CAUSE: What do you think is causing the headache?     Unsure   7. MIGRAINE: Have you been diagnosed with migraine headaches? If Yes, ask: Is this headache similar?      No   8. HEAD INJURY: Has there been any recent injury to your head?      No   9. OTHER SYMPTOMS: Do you have any other symptoms? (e.g., fever, stiff neck, eye pain, sore throat, cold symptoms)     No   10.  PREGNANCY: Is there any chance you are pregnant? When was your last menstrual period? No   Patient taking Excedrin  for the pain, some relief noted. Pt. Stated she has received Toradol  injections in the past and are effective. Patient stated she will be seen in UC today as the soonest avail. Appt. Per Epic is on 10/24  Protocols used: [facility]

## 2024-03-17 ENCOUNTER — Telehealth: Admitting: Family Medicine

## 2024-03-17 DIAGNOSIS — J208 Acute bronchitis due to other specified organisms: Secondary | ICD-10-CM

## 2024-03-17 MED ORDER — PREDNISONE 10 MG (21) PO TBPK: ORAL_TABLET | ORAL | 0 refills | Status: AC

## 2024-03-17 MED ORDER — PROMETHAZINE-DM 6.25-15 MG/5ML PO SYRP: 5.0000 mL | ORAL_SOLUTION | Freq: Four times a day (QID) | ORAL | 0 refills | Status: AC | PRN

## 2024-03-17 NOTE — Patient Instructions (Signed)
 " Gwendolynn KATHEE Packer, thank you for joining Chiquita CHRISTELLA Barefoot, NP for today's virtual visit.  While this provider is not your primary care provider (PCP), if your PCP is located in our provider database this encounter information will be shared with them immediately following your visit.   A [place] MyChart account gives you access to today's visit and all your visits, tests, and labs performed at [facility]  click here if you don't have a [place] MyChart account or go to [email or web address]  Consent: (Patient) Shatana B Heney provided verbal consent for this virtual visit at the beginning of the encounter.  Current Medications:  Current Outpatient Medications:    albuterol  (VENTOLIN  HFA) 108 (90 Base) MCG/ACT inhaler, Inhale 2 puffs into the lungs every 6 (six) hours as needed for wheezing or shortness of breath., Disp: 8 g, Rfl: 6   ALPRAZolam (XANAX) 0.5 MG tablet, Take 0.5 mg by mouth as needed., Disp: , Rfl:    amphetamine-dextroamphetamine (ADDERALL XR) 20 MG 24 hr capsule, Take by mouth every morning., Disp: , Rfl:    buPROPion ER (WELLBUTRIN SR) 100 MG 12 hr tablet, Take 100 mg by mouth every morning., Disp: , Rfl:    citalopram (CELEXA) 40 MG tablet, Take 40 mg by mouth at bedtime., Disp: , Rfl:    Dextromethorphan-buPROPion ER (AUVELITY) 45-105 MG TBCR, Take 1 tablet by mouth in the morning and at bedtime., Disp: , Rfl:    dicyclomine  (BENTYL ) 20 MG tablet, Take 1 tablet (20 mg total) by mouth 2 (two) times daily., Disp: 20 tablet, Rfl: 0   glycopyrrolate  (ROBINUL ) 2 MG tablet, Take 2 tablets (4 mg total) by mouth daily., Disp: 180 tablet, Rfl: 3   HYDROcodone -acetaminophen  (NORCO/VICODIN) 5-325 MG tablet, Take 1 tablet by mouth every 6 (six) hours as needed for severe pain (pain score 7-10)., Disp: 6 tablet, Rfl: 0   levonorgestrel  (MIRENA ) 20 MCG/DAY IUD, by Intrauterine route. INSERTED 10/10/21, Disp: , Rfl:    ondansetron  (ZOFRAN -ODT) 4 MG disintegrating  tablet, Take 1 tablet (4 mg total) by mouth every 8 (eight) hours as needed for nausea or vomiting., Disp: 20 tablet, Rfl: 0   VRAYLAR 3 MG capsule, Take 3 mg by mouth at bedtime., Disp: , Rfl:    Medications ordered in this encounter:  No orders of the defined types were placed in this encounter.    *If you need refills on other medications prior to your next appointment, please contact your pharmacy*  Follow-Up: Call back or seek an in-person evaluation if the symptoms worsen or if the condition fails to improve as anticipated.  [facility] [phone]  Other Instructions  - Take meds as prescribed - Rest voice - Use a cool mist humidifier especially during the winter months when heat dries out the air. - Use saline nose sprays frequently to help soothe nasal passages if they are drying out. - Stay hydrated by drinking plenty of fluids - Keep thermostat turn down low to prevent drying out which can cause a dry cough.   If you do not improve you will need a follow up visit in person.                  If you have been instructed to have an in-person evaluation today at a local Urgent Care facility, please use the link below. It will take you to a list of all of our available [place] [facility], including address, phone number and hours  of operation. Please do not delay care.  [facility] [facility]  If you or a family member do not have a primary care provider, use the link below to schedule a visit and establish care. When you choose a [facility] primary care physician or advanced practice provider, you gain a long-term partner in health. Find a Primary Care Provider  Learn more about [place]'s in-office and virtual care options: [place] - Get Care Now  "

## 2024-03-17 NOTE — Progress Notes (Signed)
 " Virtual Visit Consent   Anna Vargas, you are scheduled for a virtual visit with a [facility] provider today. Just as with appointments in the office, your consent must be obtained to participate. Your consent will be active for this visit and any virtual visit you may have with one of our providers in the next 365 days. If you have a MyChart account, a copy of this consent can be sent to you electronically.  As this is a virtual visit, video technology does not allow for your provider to perform a traditional examination. This may limit your provider's ability to fully assess your condition. If your provider identifies any concerns that need to be evaluated in person or the need to arrange testing (such as labs, EKG, etc.), we will make arrangements to do so. Although advances in technology are sophisticated, we cannot ensure that it will always work on either your end or our end. If the connection with a video visit is poor, the visit may have to be switched to a telephone visit. With either a video or telephone visit, we are not always able to ensure that we have a secure connection.  By engaging in this virtual visit, you consent to the provision of healthcare and authorize for your insurance to be billed (if applicable) for the services provided during this visit. Depending on your insurance coverage, you may receive a charge related to this service.  I need to obtain your verbal consent now. Are you willing to proceed with your visit today? Kaylynn B Hata has provided verbal consent on 03/17/2024 for a virtual visit (video or telephone). Chiquita CHRISTELLA Barefoot, NP  Date: 03/17/2024 2:15 PM   Virtual Visit via Video Note   I, Chiquita CHRISTELLA Barefoot, connected with  Anna Vargas  ([id number], 07-25-1990) on 03/17/2024 at  2:15 PM EST by a video-enabled telemedicine application and verified that I am speaking with the correct person using two identifiers.  Location: Patient: Virtual Visit Location Patient:  Home Provider: Virtual Visit Location Provider: Home Office   I discussed the limitations of evaluation and management by telemedicine and the availability of in person appointments. The patient expressed understanding and agreed to proceed.    History of Present Illness: Anna Vargas is a 34 y.o. who identifies as a female who was assigned female at birth, and is being seen today for cough lingering  Onset was a month ago had a viral URI and had cough with that and it lingered then tapered off, then this dry cough returned a week or more ago Associated symptoms are pounding headache during coughing episode   Modifying factors are ny quil- no help  Denies chest pain, shortness of breath, fevers, chills  Problems:  Patient Active Problem List   Diagnosis Date Noted   Multifocal pneumonia 07/29/2023   Migraine without aura and without status migrainosus, not intractable 11/21/2022   At risk for sleep apnea 11/19/2022   Daytime somnolence 11/19/2022   Low energy 04/10/2022   Anxiety 04/03/2021   Morbid obesity with BMI of 40.0-44.9, adult (HCC) 04/03/2021   Pure hypercholesterolemia 04/03/2021   Recurrent major depression in full remission 04/03/2021   Vitamin D  deficiency 04/03/2021   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  Resting comfortably  at home.  Head is normocephalic, atraumatic.  No labored breathing.  Speech is clear and  coherent with logical content.  Patient is alert and oriented at baseline.  Dry cough present   Assessment and Plan:    1. Acute viral bronchitis (Primary)  - predniSONE  (STERAPRED UNI-PAK 21 TAB) 10 MG (21) TBPK tablet; Take as directed  Dispense: 21 tablet; Refill: 0 - promethazine -dextromethorphan (PROMETHAZINE -DM) 6.25-15 MG/5ML syrup; Take 5 mLs by mouth 4 (four)  times daily as needed for cough.  Dispense: 118 mL; Refill: 0   - Take meds as prescribed - Rest voice - Use a cool mist humidifier especially during the winter months when heat dries out the air. - Use saline nose sprays frequently to help soothe nasal passages if they are drying out. - Stay hydrated by drinking plenty of fluids - Keep thermostat turn down low to prevent drying out which can cause a dry cough.  If you do not improve you will need a follow up visit in person.                 Reviewed side effects, risks and benefits of medication.    Patient acknowledged agreement and understanding of the plan.   Past Medical, Surgical, Social History, Allergies, and Medications have been Reviewed.   Follow Up Instructions: I discussed the assessment and treatment plan with the patient. The patient was provided an opportunity to ask questions and all were answered. The patient agreed with the plan and demonstrated an understanding of the instructions.  A copy of instructions were sent to the patient via MyChart unless otherwise noted below.    The patient was advised to call back or seek an in-person evaluation if the symptoms worsen or if the condition fails to improve as anticipated.    Chiquita CHRISTELLA Barefoot, NP     [1]  Allergies Allergen Reactions   Latex Anaphylaxis  [2]  Current Outpatient Medications:    albuterol  (VENTOLIN  HFA) 108 (90 Base) MCG/ACT inhaler, Inhale 2 puffs into the lungs every 6 (six) hours as needed for wheezing or shortness of breath., Disp: 8 g, Rfl: 6   ALPRAZolam (XANAX) 0.5 MG tablet, Take 0.5 mg by mouth as needed., Disp: , Rfl:    amphetamine-dextroamphetamine (ADDERALL XR) 20 MG 24 hr capsule, Take by mouth every morning., Disp: , Rfl:    buPROPion ER (WELLBUTRIN SR) 100 MG 12 hr tablet, Take 100 mg by mouth every morning., Disp: , Rfl:    citalopram (CELEXA) 40 MG tablet, Take 40 mg by mouth at bedtime., Disp: , Rfl:     Dextromethorphan-buPROPion ER (AUVELITY) 45-105 MG TBCR, Take 1 tablet by mouth in the morning and at bedtime., Disp: , Rfl:    dicyclomine  (BENTYL ) 20 MG tablet, Take 1 tablet (20 mg total) by mouth 2 (two) times daily., Disp: 20 tablet, Rfl: 0   glycopyrrolate  (ROBINUL ) 2 MG tablet, Take 2 tablets (4 mg total) by mouth daily., Disp: 180 tablet, Rfl: 3   HYDROcodone -acetaminophen  (NORCO/VICODIN) 5-325 MG tablet, Take 1 tablet by mouth every 6 (six) hours as needed for severe pain (pain score 7-10)., Disp: 6 tablet, Rfl: 0   levonorgestrel  (MIRENA ) 20 MCG/DAY IUD, by Intrauterine route. INSERTED 10/10/21, Disp: , Rfl:    ondansetron  (ZOFRAN -ODT) 4 MG disintegrating tablet, Take 1 tablet (4 mg total) by mouth every 8 (eight) hours as needed for nausea or vomiting., Disp: 20 tablet, Rfl: 0   VRAYLAR 3 MG capsule, Take 3 mg by mouth at bedtime., Disp: , Rfl:   "
